# Patient Record
Sex: Female | Born: 1990 | Race: White | Hispanic: No | Smoking: Current every day smoker
Health system: Southern US, Community
[De-identification: ages and names within clinical notes are randomized; demographics above are authoritative.]

## PROBLEM LIST (undated history)

## (undated) DIAGNOSIS — F329 Major depressive disorder, single episode, unspecified: Secondary | ICD-10-CM

## (undated) DIAGNOSIS — J302 Other seasonal allergic rhinitis: Secondary | ICD-10-CM

## (undated) DIAGNOSIS — J45909 Unspecified asthma, uncomplicated: Secondary | ICD-10-CM

## (undated) DIAGNOSIS — F32A Depression, unspecified: Secondary | ICD-10-CM

## (undated) DIAGNOSIS — F419 Anxiety disorder, unspecified: Secondary | ICD-10-CM

## (undated) DIAGNOSIS — F988 Other specified behavioral and emotional disorders with onset usually occurring in childhood and adolescence: Secondary | ICD-10-CM

## (undated) HISTORY — DX: Depression, unspecified: F32.A

## (undated) HISTORY — DX: Other specified behavioral and emotional disorders with onset usually occurring in childhood and adolescence: F98.8

## (undated) HISTORY — DX: Unspecified asthma, uncomplicated: J45.909

## (undated) HISTORY — PX: ORIF FOREARM FRACTURE: SHX2124

## (undated) HISTORY — DX: Anxiety disorder, unspecified: F41.9

## (undated) HISTORY — DX: Major depressive disorder, single episode, unspecified: F32.9

## (undated) HISTORY — PX: WISDOM TOOTH EXTRACTION: SHX21

---
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
## 2012-12-08 ENCOUNTER — Encounter (HOSPITAL_COMMUNITY): Payer: Self-pay

## 2012-12-08 ENCOUNTER — Emergency Department (HOSPITAL_COMMUNITY)
Admission: EM | Admit: 2012-12-08 | Discharge: 2012-12-08 | Disposition: A | Discharge status: Home / Self Care | Payer: Self-pay | Attending: Emergency Medicine | Admitting: Emergency Medicine

## 2012-12-08 DIAGNOSIS — F17200 Nicotine dependence, unspecified, uncomplicated: Secondary | ICD-10-CM | POA: Insufficient documentation

## 2012-12-08 DIAGNOSIS — Z79899 Other long term (current) drug therapy: Secondary | ICD-10-CM | POA: Insufficient documentation

## 2012-12-08 DIAGNOSIS — J069 Acute upper respiratory infection, unspecified: Secondary | ICD-10-CM

## 2012-12-08 DIAGNOSIS — R0982 Postnasal drip: Secondary | ICD-10-CM

## 2012-12-08 DIAGNOSIS — J029 Acute pharyngitis, unspecified: Secondary | ICD-10-CM

## 2012-12-08 MED ORDER — GUAIFENESIN ER 600 MG PO TB12: 1200.0000 mg | ORAL_TABLET | Freq: Two times a day (BID) | ORAL | Status: DC

## 2012-12-08 MED ORDER — DIPHENHYDRAMINE HCL 12.5 MG/5ML PO ELIX
25.0000 mg | ORAL_SOLUTION | Freq: Once | ORAL | Status: AC
  Administered 2012-12-08: 25 mg via ORAL
  Filled 2012-12-08: qty 10

## 2012-12-08 MED ORDER — GI COCKTAIL ~~LOC~~
30.0000 mL | Freq: Once | ORAL | Status: AC
  Administered 2012-12-08: 30 mL via ORAL
  Filled 2012-12-08: qty 30

## 2012-12-08 MED ORDER — HYDROCODONE-ACETAMINOPHEN 5-325 MG PO TABS
1.0000 | ORAL_TABLET | Freq: Once | ORAL | Status: AC
  Administered 2012-12-08: 1 via ORAL
  Filled 2012-12-08: qty 1

## 2012-12-08 NOTE — ED Provider Notes (Signed)
Medical screening examination/treatment/procedure(s) were performed by non-physician practitioner and as supervising physician I was immediately available for consultation/collaboration.  Jones Skene, M.D.     Jones Skene, MD 12/08/12 [phone]

## 2012-12-08 NOTE — ED Notes (Signed)
Patient presents with c/o headache, fever, chills, runny/stuffy nose, sore throat, non productive cough x 3 days. Taken OTC cold/flu medications that relieved symptoms except for sore throat. Hurts to swallow. Denies sweats or chills.

## 2012-12-08 NOTE — ED Provider Notes (Signed)
History     CSN: [id number]  Arrival date & time 12/08/12  0226   First MD Initiated Contact with Patient 12/08/12 [phone]      Chief Complaint  Patient presents with  . URI  . Sore Throat   HPI  History provided by the patient. Patient is a 22 year old female with no significant PMH who presents with complaints of headache, sore throat, nasal congestion. Symptoms first began Friday and have been waxing and waning but progressively worsening. Patient has used some over-the-counter cough and cold medicines and states that some of her symptoms are improving but her sore throat is much worse especially at night and early this morning. She reports headache has improved and some of her congestion and runny does also improved. She still reports some slight congestion and a little slight sinus pressure. Denies any neck pain or stiffness. Denies any fever, chills or sweats. No appetite change. No nausea, vomiting or diarrhea. No sick contacts. No recent travel. No other aggravating or alleviating factors. No other associated symptoms.    History reviewed. No pertinent past medical history.  Past Surgical History  Procedure Laterality Date  . Wisdom tooth extraction    . Orif forearm fracture Right     No family history on file.  History  Substance Use Topics  . Smoking status: Current Every Day Smoker -- 0.50 packs/day    Types: Cigarettes  . Smokeless tobacco: Never Used  . Alcohol Use: Yes    OB History   Grav Para Term Preterm Abortions TAB SAB Ect Mult Living                  Review of Systems  Constitutional: Negative for fever and chills.  HENT: Positive for congestion, sore throat and sinus pressure.   Respiratory: Positive for cough.   Gastrointestinal: Negative for nausea, vomiting, diarrhea and constipation.  Neurological: Positive for headaches.  All other systems reviewed and are negative.    Allergies  Review of patient's allergies indicates no known  allergies.  Home Medications   Current Outpatient Rx  Name  Route  Sig  Dispense  Refill  . DULoxetine (CYMBALTA) 60 MG capsule   Oral   Take 60 mg by mouth daily.         Marland Kitchen gabapentin (NEURONTIN) 100 MG capsule   Oral   Take 200 mg by mouth daily.         Marland Kitchen ibuprofen (ADVIL,MOTRIN) 200 MG tablet   Oral   Take 200 mg by mouth every 6 (six) hours as needed for pain.         Marland Kitchen lamoTRIgine (LAMICTAL) 25 MG tablet   Oral   Take 50 mg by mouth daily.         . Pseudoeph-Doxylamine-DM-APAP (NYQUIL PO)   Oral   Take 30 mLs by mouth every 6 (six) hours as needed (cold symptoms).         . Pseudoephedrine-APAP-DM (DAYQUIL PO)   Oral   Take 30 mLs by mouth every 6 (six) hours as needed (cold symptoms).           BP 129/73  Pulse 100  Temp(Src) 98.6 F (37 C) (Oral)  Resp 18  SpO2 100%  LMP 12/01/2012  Physical Exam  Nursing note and vitals reviewed. Constitutional: She is oriented to person, place, and time. She appears well-developed and well-nourished. No distress.  HENT:  Head: Normocephalic.  Right Ear: Tympanic membrane normal.  Left Ear: Tympanic membrane normal.  Mouth/Throat: Oropharynx is clear and moist.  Uvula normal and midline. Slight erythema to the pharynx. Mild cobblestoning present. Tonsils appear normal without any exudate. No signs concerning for PTA  Neck: Normal range of motion. Neck supple.  No meningeal sign  Cardiovascular: Normal rate and regular rhythm.   Pulmonary/Chest: Effort normal and breath sounds normal. No respiratory distress. She has no wheezes. She has no rales.  Abdominal: Soft.  Musculoskeletal: Normal range of motion.  Lymphadenopathy:    She has no cervical adenopathy.  Neurological: She is alert and oriented to person, place, and time.  Skin: Skin is warm and dry. No rash noted.  Psychiatric: She has a normal mood and affect. Her behavior is normal.    ED Course  Procedures       1. Pharyngitis   2.  Postnasal drip   3. URI (upper respiratory infection)       MDM  4:45 AM patient seen and evaluated. Patient well-appearing in no acute distress. She is not appears ill or toxic.        Angus Seller, PA-C 12/08/12 [phone]

## 2013-08-07 ENCOUNTER — Encounter (HOSPITAL_COMMUNITY): Payer: Self-pay | Admitting: Emergency Medicine

## 2013-08-07 ENCOUNTER — Emergency Department (HOSPITAL_COMMUNITY)
Admission: EM | Admit: 2013-08-07 | Discharge: 2013-08-07 | Disposition: A | Discharge status: Home / Self Care | Payer: Self-pay | Attending: Emergency Medicine | Admitting: Emergency Medicine

## 2013-08-07 DIAGNOSIS — J029 Acute pharyngitis, unspecified: Secondary | ICD-10-CM | POA: Insufficient documentation

## 2013-08-07 DIAGNOSIS — F17200 Nicotine dependence, unspecified, uncomplicated: Secondary | ICD-10-CM | POA: Insufficient documentation

## 2013-08-07 DIAGNOSIS — Z79899 Other long term (current) drug therapy: Secondary | ICD-10-CM | POA: Insufficient documentation

## 2013-08-07 DIAGNOSIS — R51 Headache: Secondary | ICD-10-CM | POA: Insufficient documentation

## 2013-08-07 DIAGNOSIS — B9789 Other viral agents as the cause of diseases classified elsewhere: Secondary | ICD-10-CM | POA: Insufficient documentation

## 2013-08-07 DIAGNOSIS — B349 Viral infection, unspecified: Secondary | ICD-10-CM

## 2013-08-07 LAB — RAPID STREP SCREEN (MED CTR MEBANE ONLY): Streptococcus, Group A Screen (Direct): NEGATIVE

## 2013-08-07 MED ORDER — IBUPROFEN 800 MG PO TABS: 800.0000 mg | ORAL_TABLET | Freq: Three times a day (TID) | ORAL | Status: DC

## 2013-08-07 MED ORDER — IBUPROFEN 800 MG PO TABS
800.0000 mg | ORAL_TABLET | Freq: Once | ORAL | Status: AC
  Administered 2013-08-07: 800 mg via ORAL
  Filled 2013-08-07: qty 1

## 2013-08-07 MED ORDER — GUAIFENESIN 100 MG/5ML PO LIQD: 100.0000 mg | ORAL | Status: DC | PRN

## 2013-08-07 MED ORDER — ALBUTEROL SULFATE HFA 108 (90 BASE) MCG/ACT IN AERS
2.0000 | INHALATION_SPRAY | Freq: Once | RESPIRATORY_TRACT | Status: AC
  Administered 2013-08-07: 2 via RESPIRATORY_TRACT
  Filled 2013-08-07: qty 6.7

## 2013-08-07 MED ORDER — ACETAMINOPHEN 500 MG PO TABS
1000.0000 mg | ORAL_TABLET | Freq: Once | ORAL | Status: AC
  Administered 2013-08-07: 1000 mg via ORAL
  Filled 2013-08-07: qty 2

## 2013-08-07 NOTE — ED Notes (Addendum)
Pt pulse oximetry was 95% before and 92 % after walking. Pt tolerated walking well.

## 2013-08-07 NOTE — ED Notes (Signed)
Patient returned from xray at this time.

## 2013-08-07 NOTE — ED Notes (Signed)
Dr. Opitz at bedside. 

## 2013-08-07 NOTE — ED Notes (Signed)
Patient and patient's mother are concerned about a "rash" that the patient is developing. Small, raised area noted behind left ear and on left shoulder. Patient states that it "itches." Dr. Dierdre Highman made aware.

## 2013-08-07 NOTE — ED Provider Notes (Signed)
CSN: [id number]     Arrival date & time 08/07/13  4098 History   First MD Initiated Contact with Patient 08/07/13 0402     Chief Complaint  Patient presents with  . Fever  . Headache  . Chills   (Consider location/radiation/quality/duration/timing/severity/associated sxs/prior Treatment) HPI History provided by patient. Went to bed last night in her normal state of health and woke up this morning with fever to 101, chills, body aches and sore throat. Symptoms moderate to severe. 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 Some dry cough but denies any productive sputum, hemoptysis or difficulty breathing. She did not receive a flu shot this year. No bowel pain. No nausea vomiting diarrhea. History reviewed. No pertinent past medical history. Past Surgical History  Procedure Laterality Date  . Wisdom tooth extraction    . Orif forearm fracture Right    No family history on file. History  Substance Use Topics  . Smoking status: Current Every Day Smoker -- 0.50 packs/day    Types: Cigarettes  . Smokeless tobacco: Never Used  . Alcohol Use: Yes   OB History   Grav Para Term Preterm Abortions TAB SAB Ect Mult Living                 Review of Systems  Constitutional: Positive for fever and chills.  HENT: Positive for sore throat.   Eyes: Negative for visual disturbance.  Respiratory: Negative for shortness of breath.   Cardiovascular: Negative for chest pain.  Gastrointestinal: Negative for abdominal pain.  Genitourinary: Negative for dysuria.  Musculoskeletal: Negative for back pain, neck pain and neck stiffness.  Skin: Negative for rash.  Neurological: Negative for headaches.  All other systems reviewed and are negative.    Allergies  Review of patient's allergies indicates no known allergies.  Home Medications   Current Outpatient Rx  Name  Route  Sig  Dispense  Refill  . DULoxetine (CYMBALTA) 60 MG capsule   Oral   Take 60 mg by mouth daily.          Marland Kitchen gabapentin (NEURONTIN) 100 MG capsule   Oral   Take 100 mg by mouth 2 (two) times daily.          . LamoTRIgine 50 MG TBDP   Oral   Take 25 mg by mouth daily.          BP 107/53  Pulse 121  Ht 5\' 5"  (1.651 m)  Wt 245 lb (111.131 kg)  BMI 40.77 kg/m2  SpO2 96% Physical Exam  Constitutional: She is oriented to person, place, and time. She appears well-developed and well-nourished.  HENT:  Head: Normocephalic and atraumatic.  Enlarged tonsils without erythema or exudates. Moist mucous membranes.nasal congestion.  Eyes: EOM are normal. Pupils are equal, round, and reactive to light.  Neck: Neck supple. No tracheal deviation present.  Cardiovascular: Regular rhythm and intact distal pulses.   tachycardic  Pulmonary/Chest: Effort normal and breath sounds normal. No stridor. No respiratory distress. She exhibits no tenderness.  Abdominal: Soft. She exhibits no distension. There is no tenderness.  Musculoskeletal: Normal range of motion. She exhibits no edema.  Neurological: She is alert and oriented to person, place, and time.  Skin: Skin is warm and dry.    ED Course  Procedures (including critical care time) Labs Review Labs Reviewed  RAPID STREP SCREEN  CULTURE, GROUP A STREP   By mouth fluids. Tylenol. Motrin.  Nursing notes reviewed with document pulse ox 81%. Patient ambulating  emergency department without any dyspnea or hypoxia. Normal pulmonary exam without wheezes. Presentation suggests viral infection with negative rapid strep test. On recheck is symptomatically improving. Plan discharge home with viral precautions and close outpatient followup. Prescription for Motrin, Robitussin provided. Patient sent home with albuterol inhaler as needed by request.  MDM  Diagnosis: Viral infection  Medications provided Rapid strep test was negative Symptomatically improved on recheck Vital signs and nurses notes reviewed and considered  Sunnie Nielsen, MD 08/07/13 [phone]

## 2013-08-07 NOTE — ED Notes (Signed)
Patient began having fever/chills starting last night. Patient is also having a headache that started when she got out of bed and was moving around. Patient states that the pain is int he occipital portion of her head. Denies n/v.

## 2013-08-07 NOTE — ED Notes (Signed)
Patient currently ambulating with EMT. No distress noted.

## 2013-08-09 LAB — CULTURE, GROUP A STREP

## 2013-10-09 ENCOUNTER — Emergency Department (HOSPITAL_COMMUNITY)
Admission: EM | Admit: 2013-10-09 | Discharge: 2013-10-09 | Disposition: A | Discharge status: Home / Self Care | Payer: Self-pay | Attending: Emergency Medicine | Admitting: Emergency Medicine

## 2013-10-09 ENCOUNTER — Encounter (HOSPITAL_COMMUNITY): Payer: Self-pay | Admitting: Emergency Medicine

## 2013-10-09 DIAGNOSIS — Z3202 Encounter for pregnancy test, result negative: Secondary | ICD-10-CM | POA: Insufficient documentation

## 2013-10-09 DIAGNOSIS — F17200 Nicotine dependence, unspecified, uncomplicated: Secondary | ICD-10-CM | POA: Insufficient documentation

## 2013-10-09 DIAGNOSIS — R000 Tachycardia, unspecified: Secondary | ICD-10-CM | POA: Insufficient documentation

## 2013-10-09 DIAGNOSIS — Z79899 Other long term (current) drug therapy: Secondary | ICD-10-CM | POA: Insufficient documentation

## 2013-10-09 DIAGNOSIS — J111 Influenza due to unidentified influenza virus with other respiratory manifestations: Secondary | ICD-10-CM | POA: Insufficient documentation

## 2013-10-09 LAB — CBC WITH DIFFERENTIAL/PLATELET
Basophils Absolute: 0 10*3/uL (ref 0.0–0.1)
Basophils Relative: 0 % (ref 0–1)
Eosinophils Absolute: 0 10*3/uL (ref 0.0–0.7)
Eosinophils Relative: 0 % (ref 0–5)
HCT: 44.1 % (ref 36.0–46.0)
Hemoglobin: 15.4 g/dL — ABNORMAL HIGH (ref 12.0–15.0)
Lymphocytes Relative: 6 % — ABNORMAL LOW (ref 12–46)
Lymphs Abs: 0.5 10*3/uL — ABNORMAL LOW (ref 0.7–4.0)
MCH: 32.4 pg (ref 26.0–34.0)
MCHC: 34.9 g/dL (ref 30.0–36.0)
MCV: 92.6 fL (ref 78.0–100.0)
Monocytes Absolute: 0.5 10*3/uL (ref 0.1–1.0)
Monocytes Relative: 6 % (ref 3–12)
Neutro Abs: 7.7 10*3/uL (ref 1.7–7.7)
Neutrophils Relative %: 89 % — ABNORMAL HIGH (ref 43–77)
Platelets: 313 10*3/uL (ref 150–400)
RBC: 4.76 MIL/uL (ref 3.87–5.11)
RDW: 12.8 % (ref 11.5–15.5)
WBC: 8.7 10*3/uL (ref 4.0–10.5)

## 2013-10-09 LAB — URINALYSIS, ROUTINE W REFLEX MICROSCOPIC
Bilirubin Urine: NEGATIVE
Glucose, UA: NEGATIVE mg/dL
Hgb urine dipstick: NEGATIVE
Ketones, ur: NEGATIVE mg/dL
Nitrite: NEGATIVE
Protein, ur: NEGATIVE mg/dL
Specific Gravity, Urine: 1.021 (ref 1.005–1.030)
Urobilinogen, UA: 0.2 mg/dL (ref 0.0–1.0)
pH: 6 (ref 5.0–8.0)

## 2013-10-09 LAB — COMPREHENSIVE METABOLIC PANEL
ALT: 13 U/L (ref 0–35)
AST: 19 U/L (ref 0–37)
Albumin: 3.7 g/dL (ref 3.5–5.2)
Alkaline Phosphatase: 94 U/L (ref 39–117)
BUN: 7 mg/dL (ref 6–23)
CO2: 23 mEq/L (ref 19–32)
Calcium: 9.1 mg/dL (ref 8.4–10.5)
Chloride: 97 mEq/L (ref 96–112)
Creatinine, Ser: 0.6 mg/dL (ref 0.50–1.10)
GFR calc Af Amer: >90 mL/min (ref >90)
GFR calc non Af Amer: >90 mL/min (ref >90)
Glucose, Bld: 118 mg/dL — ABNORMAL HIGH (ref 70–99)
Potassium: 3.5 mEq/L — ABNORMAL LOW (ref 3.7–5.3)
Sodium: 136 mEq/L — ABNORMAL LOW (ref 137–147)
Total Bilirubin: 0.4 mg/dL (ref 0.3–1.2)
Total Protein: 8.6 g/dL — ABNORMAL HIGH (ref 6.0–8.3)

## 2013-10-09 LAB — LIPASE, BLOOD: Lipase: 14 U/L (ref 11–59)

## 2013-10-09 LAB — POCT PREGNANCY, URINE: Preg Test, Ur: NEGATIVE

## 2013-10-09 LAB — URINE MICROSCOPIC-ADD ON

## 2013-10-09 MED ORDER — ONDANSETRON 4 MG PO TBDP
8.0000 mg | ORAL_TABLET | Freq: Once | ORAL | Status: AC
  Administered 2013-10-09: 8 mg via ORAL
  Filled 2013-10-09: qty 2

## 2013-10-09 MED ORDER — KETOROLAC TROMETHAMINE 30 MG/ML IJ SOLN
30.0000 mg | Freq: Once | INTRAMUSCULAR | Status: AC
  Administered 2013-10-09: 30 mg via INTRAVENOUS
  Filled 2013-10-09: qty 1

## 2013-10-09 MED ORDER — ONDANSETRON HCL 4 MG PO TABS: 4.0000 mg | ORAL_TABLET | Freq: Four times a day (QID) | ORAL | Status: DC

## 2013-10-09 MED ORDER — POTASSIUM CHLORIDE CRYS ER 20 MEQ PO TBCR
40.0000 meq | EXTENDED_RELEASE_TABLET | Freq: Once | ORAL | Status: AC
Start: 2013-10-09 — End: 2013-10-09
  Administered 2013-10-09: 40 meq via ORAL
  Filled 2013-10-09: qty 2

## 2013-10-09 MED ORDER — SODIUM CHLORIDE 0.9 % IV BOLUS (SEPSIS)
1000.0000 mL | Freq: Once | INTRAVENOUS | Status: AC
  Administered 2013-10-09: 1000 mL via INTRAVENOUS

## 2013-10-09 NOTE — ED Provider Notes (Signed)
CSN: [id number]     Arrival date & time 10/09/13  1802 History   First MD Initiated Contact with Patient 10/09/13 1825     Chief Complaint  Patient presents with  . Fever  . Nausea  . Emesis   (Consider location/radiation/quality/duration/timing/severity/associated sxs/prior Treatment) Patient is a 23 y.o. female presenting with vomiting and flu symptoms. The history is provided by the patient. No language interpreter was used.  Emesis Severity:  Mild Duration:  2 hours Timing:  Intermittent Number of daily episodes:  1 Quality:  Stomach contents and undigested food Progression:  Resolved Chronicity:  New Associated symptoms: chills, diarrhea, headaches and myalgias   Associated symptoms: no abdominal pain, no arthralgias and no sore throat   Diarrhea:    Quality:  Watery   Number of occurrences:  >10   Severity:  Moderate   Duration:  2 days   Timing:  Intermittent   Progression:  Unchanged Headaches:    Severity:  Mild Influenza Presenting symptoms: diarrhea, fatigue, fever, headache, myalgias, nausea, rhinorrhea and vomiting   Presenting symptoms: no cough, no shortness of breath and no sore throat   Severity:  Moderate Onset quality:  Sudden Duration:  2 days Progression:  Worsening Chronicity:  New Relieved by:  Nothing Worsened by:  Eating Ineffective treatments:  OTC medications Associated symptoms: chills, decreased appetite, decreased physical activity and nasal congestion   Associated symptoms: no ear pain, no mental status change, no neck stiffness and no witnessed syncope   Risk factors: sick contacts   Risk factors: no diabetes problem, no heart disease, no immunocompromised state, no kidney disease, no liver disease and not pregnant     History reviewed. No pertinent past medical history. Past Surgical History  Procedure Laterality Date  . Wisdom tooth extraction    . Orif forearm fracture Right    No family history on file. History  Substance Use  Topics  . Smoking status: Current Every Day Smoker -- 0.50 packs/day    Types: Cigarettes  . Smokeless tobacco: Never Used  . Alcohol Use: Yes   OB History   Grav Para Term Preterm Abortions TAB SAB Ect Mult Living                 Review of Systems  Constitutional: Positive for fever, chills, activity change, appetite change, fatigue and decreased appetite. Negative for diaphoresis.  HENT: Positive for congestion and rhinorrhea. Negative for ear pain, facial swelling and sore throat.   Eyes: Negative for photophobia and discharge.  Respiratory: Negative for cough, chest tightness and shortness of breath.   Cardiovascular: Negative for chest pain, palpitations and leg swelling.  Gastrointestinal: Positive for nausea, vomiting and diarrhea. Negative for abdominal pain.  Endocrine: Negative for polydipsia and polyuria.  Genitourinary: Negative for dysuria, frequency, difficulty urinating and pelvic pain.  Musculoskeletal: Positive for myalgias. Negative for arthralgias, back pain, neck pain and neck stiffness.  Skin: Negative for color change and wound.  Allergic/Immunologic: Negative for immunocompromised state.  Neurological: Positive for headaches. Negative for facial asymmetry, weakness and numbness.  Hematological: Does not bruise/bleed easily.  Psychiatric/Behavioral: Negative for confusion and agitation.    Allergies  Review of patient's allergies indicates no known allergies.  Home Medications   Current Outpatient Rx  Name  Route  Sig  Dispense  Refill  . DULoxetine (CYMBALTA) 60 MG capsule   Oral   Take 60 mg by mouth at bedtime.          . gabapentin (NEURONTIN) 100  MG capsule   Oral   Take 100 mg by mouth 2 (two) times daily.          Marland Kitchen ibuprofen (ADVIL,MOTRIN) 800 MG tablet   Oral   Take 800 mg by mouth every 8 (eight) hours as needed.         . LamoTRIgine 50 MG TBDP   Oral   Take 25 mg by mouth at bedtime.          BP 114/52  Pulse 120   Temp(Src) 99.6 F (37.6 C) (Oral)  Resp 20  SpO2 100%  LMP 09/16/2013 Physical Exam  Constitutional: She is oriented to person, place, and time. She appears well-developed and well-nourished. No distress.  HENT:  Head: Normocephalic and atraumatic.  Mouth/Throat: No oropharyngeal exudate.  Eyes: Pupils are equal, round, and reactive to light.  Neck: Normal range of motion. Neck supple.  Cardiovascular: Regular rhythm and normal heart sounds.  Tachycardia present.  Exam reveals no gallop and no friction rub.   No murmur heard. Pulmonary/Chest: Effort normal and breath sounds normal. No respiratory distress. She has no wheezes. She has no rales.  Abdominal: Soft. Bowel sounds are normal. She exhibits no distension and no mass. There is no tenderness. There is no rebound and no guarding.  Musculoskeletal: Normal range of motion. She exhibits no edema and no tenderness.  Neurological: She is alert and oriented to person, place, and time.  Skin: Skin is warm and dry.  Psychiatric: She has a normal mood and affect.    ED Course  Procedures (including critical care time) Labs Review Labs Reviewed  CBC WITH DIFFERENTIAL - Abnormal; Notable for the following:    Hemoglobin 15.4 (*)    Neutrophils Relative % 89 (*)    Lymphocytes Relative 6 (*)    Lymphs Abs 0.5 (*)    All other components within normal limits  COMPREHENSIVE METABOLIC PANEL - Abnormal; Notable for the following:    Sodium 136 (*)    Potassium 3.5 (*)    Glucose, Bld 118 (*)    Total Protein 8.6 (*)    All other components within normal limits  LIPASE, BLOOD  URINALYSIS, ROUTINE W REFLEX MICROSCOPIC   Imaging Review No results found.  EKG Interpretation   None       MDM  No diagnosis found. SUBJECTIVE:  Judith Hansen is a 23 y.o. female who present complaining of flu-like symptoms: fevers, chills, myalgias, congestion, nausea, vom x1, watery diarrhea, for 2 days. Denies dyspnea or wheezing. Sick contacts  with similar symptoms.   OBJECTIVE: Appears moderately ill but not toxic; temperature as noted in vitals. Ears normal. Throat and pharynx normal.  Neck supple. No adenopathy in the neck. Sinuses non tender. The chest is clear.  ASSESSMENT: Influenza  PLAN: Symptomatic therapy suggested: rest, increase fluids, OTC acetaminophen, ibuprofen.  IVF & IV toradol given in ED with improvement of HR. Return precautions given for new or worsening symptoms including inability to tolerate PO, SOB.      Shanna Cisco, MD 10/10/13 [phone]

## 2013-10-09 NOTE — ED Notes (Signed)
Pt aware of need to provide urine sample. Pts tates she cant pee at this time.

## 2013-10-09 NOTE — ED Notes (Signed)
Pt reports last night n/v/d, fever. Has taken ibuprofen liquid gels several times today. No fever at current, has been drinking ice water. Pt reports epigastric soreness from vomiting. Pt is a x 4. Mask in place.

## 2013-10-09 NOTE — Discharge Instructions (Signed)

## 2013-10-09 NOTE — ED Notes (Signed)
Pt states symptoms started last night, pt states she layed down after work, woke up in the middle of the night with severe abdominal pain. Pt states she also started liquid diarrhea, vomiting. Pt c/o epigastric pain from vomiting. Denies abdominal pain at this time. Pt c/o headache 5/10. States shes had diarrhea all day. Pt states shes been drinking water and ginger ale. No problems keeping that down. Pt AAOx4.

## 2014-03-03 ENCOUNTER — Encounter: Payer: Self-pay | Admitting: Family Medicine

## 2014-03-03 DIAGNOSIS — F329 Major depressive disorder, single episode, unspecified: Secondary | ICD-10-CM | POA: Insufficient documentation

## 2014-03-03 DIAGNOSIS — F988 Other specified behavioral and emotional disorders with onset usually occurring in childhood and adolescence: Secondary | ICD-10-CM | POA: Insufficient documentation

## 2014-03-03 DIAGNOSIS — J45909 Unspecified asthma, uncomplicated: Secondary | ICD-10-CM | POA: Insufficient documentation

## 2014-03-03 DIAGNOSIS — F419 Anxiety disorder, unspecified: Secondary | ICD-10-CM | POA: Insufficient documentation

## 2014-03-23 ENCOUNTER — Ambulatory Visit: Payer: BC Managed Care – PPO | Admitting: Physician Assistant

## 2014-03-28 ENCOUNTER — Ambulatory Visit (INDEPENDENT_AMBULATORY_CARE_PROVIDER_SITE_OTHER): Payer: BC Managed Care – PPO | Admitting: Physician Assistant

## 2014-03-28 ENCOUNTER — Encounter: Payer: Self-pay | Admitting: Physician Assistant

## 2014-03-28 VITALS — BP 120/80 | HR 78 | Temp 97.7°F | Resp 16 | Ht 65.5 in | Wt 251.0 lb

## 2014-03-28 DIAGNOSIS — Z0000 Encounter for general adult medical examination without abnormal findings: Secondary | ICD-10-CM

## 2014-03-28 DIAGNOSIS — Z720 Tobacco use: Secondary | ICD-10-CM | POA: Insufficient documentation

## 2014-03-28 DIAGNOSIS — E669 Obesity, unspecified: Secondary | ICD-10-CM

## 2014-03-28 DIAGNOSIS — F329 Major depressive disorder, single episode, unspecified: Secondary | ICD-10-CM

## 2014-03-28 DIAGNOSIS — F32A Depression, unspecified: Secondary | ICD-10-CM

## 2014-03-28 DIAGNOSIS — Z23 Encounter for immunization: Secondary | ICD-10-CM

## 2014-03-28 DIAGNOSIS — F988 Other specified behavioral and emotional disorders with onset usually occurring in childhood and adolescence: Secondary | ICD-10-CM

## 2014-03-28 DIAGNOSIS — F411 Generalized anxiety disorder: Secondary | ICD-10-CM

## 2014-03-28 DIAGNOSIS — F419 Anxiety disorder, unspecified: Secondary | ICD-10-CM

## 2014-03-28 DIAGNOSIS — J4520 Mild intermittent asthma, uncomplicated: Secondary | ICD-10-CM

## 2014-03-28 DIAGNOSIS — F3289 Other specified depressive episodes: Secondary | ICD-10-CM

## 2014-03-28 DIAGNOSIS — F17200 Nicotine dependence, unspecified, uncomplicated: Secondary | ICD-10-CM

## 2014-03-28 DIAGNOSIS — J45909 Unspecified asthma, uncomplicated: Secondary | ICD-10-CM

## 2014-03-28 MED ORDER — ALBUTEROL SULFATE HFA 108 (90 BASE) MCG/ACT IN AERS: 2.0000 | INHALATION_SPRAY | Freq: Four times a day (QID) | RESPIRATORY_TRACT | Status: DC | PRN

## 2014-03-28 MED ORDER — BECLOMETHASONE DIPROPIONATE 80 MCG/ACT IN AERS: 1.0000 | INHALATION_SPRAY | Freq: Two times a day (BID) | RESPIRATORY_TRACT | Status: DC

## 2014-03-28 NOTE — Progress Notes (Signed)
Patient ID: Judith Hansen MRN: [id number], DOB: [birth year], 23 y.o. Date of Encounter: 03/28/2014,   Chief Complaint: Physical (CPE)  HPI: 23 y.o. y/o white female  here for CPE.   She is also being seen as a new patient today to establish care with our office.  She has a gynecologist who she sees. She also has a psychiatrist who she sees on a routine basis. No other medical providers that she sees on a regular basis.  She says that she has been using her mom's Symbicort on a daily basis. Says that if she does not use this, then she has a lot of flares and wheezing.  She currently is out of any type of medication for her asthma.  She has no concerns or issues that she was wanting to discuss today. She is not fasting and is not interested in returning fasting for lab work. Says that she has had some blood work done with her gynecologist fairly recently.  She does mention that a friend of hers had used Phenteramine and had good success with weight loss.   Review of Systems: Consitutional: No fever, chills, fatigue, night sweats, lymphadenopathy. No significant/unexplained weight changes. Eyes: No visual changes, eye redness, or discharge. ENT/Mouth: No ear pain, sore throat, nasal drainage, or sinus pain. Cardiovascular: No chest pressure,heaviness, tightness or squeezing, even with exertion. No increased shortness of breath or dyspnea on exertion.No palpitations, edema, orthopnea, PND. Respiratory: No cough, hemoptysis, SOB, or wheezing. Gastrointestinal: No anorexia, dysphagia, reflux, pain, nausea, vomiting, hematemesis, diarrhea, constipation, BRBPR, or melena. Breast: No mass, nodules, bulging, or retraction. No skin changes or inflammation. No nipple discharge. No lymphadenopathy. Genitourinary: No dysuria, hematuria, incontinence, vaginal discharge, pruritis, burning, abnormal bleeding, or pain. Musculoskeletal: No decreased ROM, No joint pain or swelling. No significant  pain in neck, back, or extremities. Skin: No rash, pruritis, or concerning lesions. Neurological: No headache, dizziness, syncope, seizures, tremors, memory loss, coordination problems, or paresthesias. Psychological: No anxiety, depression, hallucinations, SI/HI. Endocrine: No polydipsia, polyphagia, polyuria, or known diabetes.No increased fatigue. No palpitations/rapid heart rate. No significant/unexplained weight change. All other systems were reviewed and are otherwise negative.  Past Medical History  Diagnosis Date  . Anxiety   . Asthma   . Depression   . ADD (attention deficit disorder)      Past Surgical History  Procedure Laterality Date  . Wisdom tooth extraction    . Orif forearm fracture Right   . Fracture surgery  2004    rt arm has plate    Home Meds:  Outpatient Prescriptions Prior to Visit  Medication Sig Dispense Refill  . DULoxetine (CYMBALTA) 60 MG capsule Take 60 mg by mouth at bedtime.       . gabapentin (NEURONTIN) 100 MG capsule Take 100 mg by mouth 2 (two) times daily.       Marland Kitchen ibuprofen (ADVIL,MOTRIN) 800 MG tablet Take 800 mg by mouth every 8 (eight) hours as needed.      . LamoTRIgine 50 MG TBDP Take 25 mg by mouth at bedtime.      . ondansetron (ZOFRAN) 4 MG tablet Take 1 tablet (4 mg total) by mouth every 6 (six) hours.  12 tablet  0   No facility-administered medications prior to visit.    Allergies: No Known Allergies  History   Social History  . Marital Status: Single    Spouse Name: N/A    Number of Children: N/A  . Years of Education: N/A   Occupational  History  . Not on file.   Social History Main Topics  . Smoking status: Current Every Day Smoker -- 0.50 packs/day    Types: Cigarettes  . Smokeless tobacco: Current User  . Alcohol Use: 1.1 oz/week    1 Cans of beer, 1 Drinks containing 0.5 oz of alcohol per week  . Drug Use: No  . Sexual Activity: Yes    Birth Control/ Protection: None   Other Topics Concern  . 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 Alcohol abuse Mother   . Arthritis Mother   . Depression Mother   . Heart disease Maternal Grandfather     Physical Exam: Blood pressure 120/80, pulse 78, temperature 97.7 F (36.5 C), temperature source Oral, resp. rate 16, height 5' 5.5" (1.664 m), weight 251 lb (113.853 kg), last menstrual period 03/13/2014., Body mass index is 41.12 kg/(m^2). General: Obese WF. Appears in no acute distress. HEENT: Normocephalic, atraumatic. Conjunctiva pink, sclera non-icteric. Pupils 2 mm constricting to 1 mm, round, regular, and equally reactive to light and accomodation. EOMI. Internal auditory canal clear. TMs with good cone of light and without pathology. Nasal mucosa pink. Nares are without discharge. No sinus tenderness. Oral mucosa pink.  Pharynx without exudate.   Neck: Supple. Trachea midline. No thyromegaly. Full ROM. No lymphadenopathy.No Carotid Bruits. Lungs: Clear to auscultation bilaterally without wheezes, rales, or rhonchi. Breathing is of normal effort and unlabored. Cardiovascular: RRR with S1 S2. No murmurs, rubs, or gallops. Distal pulses 2+ symmetrically. No carotid or abdominal bruits. Breast: Deferred. Per Gyn. Abdomen: Soft, non-tender, non-distended with normoactive bowel sounds. No hepatosplenomegaly or masses. No rebound/guarding. No CVA tenderness. No hernias.  Genitourinary: Deferred. Per Gyn. Musculoskeletal: Full range of motion and 5/5 strength throughout. Without swelling, atrophy, tenderness, crepitus, or warmth. Skin: Warm and moist without erythema, ecchymosis, wounds, or rash. Neuro: A+Ox3. CN II-XII grossly intact. Moves all extremities spontaneously. Full sensation throughout. Normal gait. DTR 2+ throughout upper and lower extremities. Finger to nose intact. Psych:  Responds to questions appropriately with a  normal affect.   Assessment/Plan:  23 y.o. y/o female here for CPE 1. Visit for preventive health examination  A. Screening Labs: She is not fasting today and is not interested in returning fasting for labs.  Says her gynecologist has checked some lab work in the last couple years.  B. Pap: Per Gynecology   C. Immunizations:  Influenza: N/A Tetanus:  Patient states that this is not up to date. Says that she does frequently get cuts etc. with her job. She does want to update tetanus vaccine today. Pneumococcal: Need to get her shot records. She is a smoker and does have asthma, she would need a Pneumovax 23. Will try to get shot records. We'll get this at her next office visit here if she has not received this in the past.  2. Asthma, mild intermittent, uncomplicated - beclomethasone (QVAR) 80 MCG/ACT inhaler; Inhale 1 puff into the lungs 2 (two) times daily.  Dispense: 1 Inhaler; Refill: 12 - albuterol (PROVENTIL HFA;VENTOLIN HFA) 108 (90 BASE) MCG/ACT inhaler; Inhale 2 puffs into the lungs every 6 (six) hours as needed for wheezing or shortness of breath.  Dispense: 1 Inhaler; Refill: 0  3. Smoker Discussed need for cessation especially given her asthma.  4. Obesity Discussed phentermine  with her. Discussed that she definitely needs to start some type of routine exercise and make significant diet changes. No prescription given today. She is to start making diet and exercise changes and followup.  5. Anxiety Per psychiatry  6. Depression Per psychiatry  7. ADD (attention deficit disorder) Per psychiatry   Signed, [address]Rickard Kennerly Beth Harmony[place]ixon, [place], [facility] 03/28/2014 9:15 AM

## 2014-06-01 ENCOUNTER — Encounter: Payer: Self-pay | Admitting: Physician Assistant

## 2014-06-01 ENCOUNTER — Ambulatory Visit (INDEPENDENT_AMBULATORY_CARE_PROVIDER_SITE_OTHER): Payer: BC Managed Care – PPO | Admitting: Physician Assistant

## 2014-06-01 VITALS — BP 104/70 | HR 88 | Temp 98.1°F | Resp 18 | Wt 248.0 lb

## 2014-06-01 DIAGNOSIS — J029 Acute pharyngitis, unspecified: Secondary | ICD-10-CM

## 2014-06-01 LAB — RAPID STREP SCREEN (MED CTR MEBANE ONLY): Streptococcus, Group A Screen (Direct): NEGATIVE

## 2014-06-01 NOTE — Progress Notes (Signed)
    Patient ID: Judith LevyChelsea Hansen MRN: [id number], DOB: [birth year], 23 y.o. Date of Encounter: 06/01/2014, 10:25 AM    Chief Complaint:  Chief Complaint  Patient presents with  . sore throat x 1 day    much worse in 1 day feels like moved into chest     HPI: 23 y.o. year old obese white female reports that her throat just started bothering her yesterday. Says her throat is sore and also feels like there is a "lump of drainage there that is hard to get down". Has had no fever or chills. No mucus from the nose no cough, no phlegm from the chest.     Home Meds:   Outpatient Prescriptions Prior to Visit  Medication Sig Dispense Refill  . albuterol (PROVENTIL HFA;VENTOLIN HFA) 108 (90 BASE) MCG/ACT inhaler Inhale 2 puffs into the lungs every 6 (six) hours as needed for wheezing or shortness of breath.  1 Inhaler  0  . beclomethasone (QVAR) 80 MCG/ACT inhaler Inhale 1 puff into the lungs 2 (two) times daily.  1 Inhaler  12  . DULoxetine (CYMBALTA) 60 MG capsule Take 60 mg by mouth at bedtime.       . gabapentin (NEURONTIN) 100 MG capsule Take 100 mg by mouth 2 (two) times daily.       Marland Kitchen. ibuprofen (ADVIL,MOTRIN) 800 MG tablet Take 800 mg by mouth every 8 (eight) hours as needed.      . LamoTRIgine 50 MG TBDP Take 25 mg by mouth at bedtime.       No facility-administered medications prior to visit.    Allergies: No Known Allergies    Review of Systems: See HPI for pertinent ROS. All other ROS negative.    Physical Exam: Blood pressure 104/70, pulse 88, temperature 98.1 F (36.7 C), temperature source Oral, resp. rate 18, weight 248 lb (112.492 kg)., Body mass index is 40.63 kg/(m^2). General:  Obese WF. Appears in no acute distress. HEENT: Normocephalic, atraumatic, eyes without discharge, sclera non-icteric, nares are without discharge. Bilateral auditory canals clear, TM's are without perforation, pearly grey and translucent with reflective cone of light bilaterally. Oral cavity  moist. Tonsils slightly enlarged bilaterally but minimal erythema, no exudate, no peritonsillar abscess.  Neck: Supple. No thyromegaly. She reports mild tenderness with palpation of bilateral anterior cervical nodes but they are not enlarged with palpation. Lungs: Clear bilaterally to auscultation without wheezes, rales, or rhonchi. Breathing is unlabored. Heart: Regular rhythm. No murmurs, rubs, or gallops. Msk:  Strength and tone normal for age. Extremities/Skin: Warm and dry.  No rashes. Neuro: Alert and oriented X 3. Moves all extremities spontaneously. Gait is normal. CNII-XII grossly in tact. Psych:  Responds to questions appropriately with a normal affect.   Results for orders placed in visit on 06/01/14  RAPID STREP SCREEN      Result Value Ref Range   Source THROAT     Streptococcus, Group A Screen (Direct) NEG  NEGATIVE     ASSESSMENT AND PLAN:  23 y.o. year old female with  1. Viral pharyngitis Recommended that she sees lozenges, spray, Tylenol, Motrin as needed for symptomatic management. Discussed indications for her to followup with us which include: Fever, significantly increasing/worsening symptoms, continued symptoms without improvement for 5-7 days-- then call us as well.  2. Sorethroat - Rapid Strep Screen   Signed, [address]Mary Beth Dayton[place]ixon, [place], [facility] 06/01/2014 10:25 AM

## 2015-03-19 ENCOUNTER — Emergency Department (HOSPITAL_COMMUNITY): Payer: Self-pay

## 2015-03-19 ENCOUNTER — Encounter (HOSPITAL_COMMUNITY): Payer: Self-pay | Admitting: *Deleted

## 2015-03-19 ENCOUNTER — Emergency Department (HOSPITAL_COMMUNITY)
Admission: EM | Admit: 2015-03-19 | Discharge: 2015-03-19 | Disposition: A | Discharge status: Home / Self Care | Payer: Self-pay | Attending: Emergency Medicine | Admitting: Emergency Medicine

## 2015-03-19 DIAGNOSIS — F419 Anxiety disorder, unspecified: Secondary | ICD-10-CM | POA: Insufficient documentation

## 2015-03-19 DIAGNOSIS — Z720 Tobacco use: Secondary | ICD-10-CM | POA: Insufficient documentation

## 2015-03-19 DIAGNOSIS — Z79899 Other long term (current) drug therapy: Secondary | ICD-10-CM | POA: Insufficient documentation

## 2015-03-19 DIAGNOSIS — Z7951 Long term (current) use of inhaled steroids: Secondary | ICD-10-CM | POA: Insufficient documentation

## 2015-03-19 DIAGNOSIS — R197 Diarrhea, unspecified: Secondary | ICD-10-CM | POA: Insufficient documentation

## 2015-03-19 DIAGNOSIS — F329 Major depressive disorder, single episode, unspecified: Secondary | ICD-10-CM | POA: Insufficient documentation

## 2015-03-19 DIAGNOSIS — J45901 Unspecified asthma with (acute) exacerbation: Secondary | ICD-10-CM | POA: Insufficient documentation

## 2015-03-19 LAB — BASIC METABOLIC PANEL
Anion gap: 9 (ref 5–15)
BUN: 6 mg/dL (ref 6–20)
CO2: 24 mmol/L (ref 22–32)
Calcium: 9.2 mg/dL (ref 8.9–10.3)
Chloride: 105 mmol/L (ref 101–111)
Creatinine, Ser: 0.6 mg/dL (ref 0.44–1.00)
GFR calc Af Amer: >60 mL/min (ref >60)
GFR calc non Af Amer: >60 mL/min (ref >60)
Glucose, Bld: 121 mg/dL — ABNORMAL HIGH (ref 65–99)
Potassium: 3.4 mmol/L — ABNORMAL LOW (ref 3.5–5.1)
Sodium: 138 mmol/L (ref 135–145)

## 2015-03-19 LAB — CBC WITH DIFFERENTIAL/PLATELET
Basophils Absolute: 0 10*3/uL (ref 0.0–0.1)
Basophils Relative: 0 % (ref 0–1)
Eosinophils Absolute: 0.8 10*3/uL — ABNORMAL HIGH (ref 0.0–0.7)
Eosinophils Relative: 8 % — ABNORMAL HIGH (ref 0–5)
HCT: 36.4 % (ref 36.0–46.0)
Hemoglobin: 12.1 g/dL (ref 12.0–15.0)
Lymphocytes Relative: 33 % (ref 12–46)
Lymphs Abs: 3.5 10*3/uL (ref 0.7–4.0)
MCH: 30.9 pg (ref 26.0–34.0)
MCHC: 33.2 g/dL (ref 30.0–36.0)
MCV: 92.9 fL (ref 78.0–100.0)
Monocytes Absolute: 0.9 10*3/uL (ref 0.1–1.0)
Monocytes Relative: 8 % (ref 3–12)
Neutro Abs: 5.5 10*3/uL (ref 1.7–7.7)
Neutrophils Relative %: 51 % (ref 43–77)
Platelets: 293 10*3/uL (ref 150–400)
RBC: 3.92 MIL/uL (ref 3.87–5.11)
RDW: 13.3 % (ref 11.5–15.5)
WBC: 10.7 10*3/uL — ABNORMAL HIGH (ref 4.0–10.5)

## 2015-03-19 LAB — RAPID STREP SCREEN (MED CTR MEBANE ONLY): Streptococcus, Group A Screen (Direct): NEGATIVE

## 2015-03-19 MED ORDER — IPRATROPIUM-ALBUTEROL 0.5-2.5 (3) MG/3ML IN SOLN
3.0000 mL | Freq: Once | RESPIRATORY_TRACT | Status: AC
  Administered 2015-03-19: 3 mL via RESPIRATORY_TRACT
  Filled 2015-03-19: qty 3

## 2015-03-19 MED ORDER — PREDNISONE 20 MG PO TABS
60.0000 mg | ORAL_TABLET | Freq: Once | ORAL | Status: AC
  Administered 2015-03-19: 60 mg via ORAL
  Filled 2015-03-19: qty 3

## 2015-03-19 MED ORDER — ALBUTEROL SULFATE HFA 108 (90 BASE) MCG/ACT IN AERS
2.0000 | INHALATION_SPRAY | RESPIRATORY_TRACT | Status: DC | PRN
  Administered 2015-03-19: 2 via RESPIRATORY_TRACT
  Filled 2015-03-19: qty 6.7

## 2015-03-19 MED ORDER — PREDNISONE 20 MG PO TABS: 60.0000 mg | ORAL_TABLET | Freq: Every day | ORAL | Status: DC

## 2015-03-19 NOTE — Discharge Instructions (Signed)
Asthma Asthma is a recurring condition in which the airways tighten and narrow. Asthma can make it difficult to breathe. It can cause coughing, wheezing, and shortness of breath. Asthma episodes, also called asthma attacks, range from minor to life-threatening. Asthma cannot be cured, but medicines and lifestyle changes can help control it. CAUSES Asthma is believed to be caused by inherited (genetic) and environmental factors, but its exact cause is unknown. Asthma may be triggered by allergens, lung infections, or irritants in the air. Asthma triggers are different for each person. Common triggers include:   Animal dander.  Dust mites.  Cockroaches.  Pollen from trees or grass.  Mold.  Smoke.  Air pollutants such as dust, household cleaners, hair sprays, aerosol sprays, paint fumes, strong chemicals, or strong odors.  Cold air, weather changes, and winds (which increase molds and pollens in the air).  Strong emotional expressions such as crying or laughing hard.  Stress.  Certain medicines (such as aspirin) or types of drugs (such as beta-blockers).  Sulfites in foods and drinks. Foods and drinks that may contain sulfites include dried fruit, potato chips, and sparkling grape juice.  Infections or inflammatory conditions such as the flu, a cold, or an inflammation of the nasal membranes (rhinitis).  Gastroesophageal reflux disease (GERD).  Exercise or strenuous activity. SYMPTOMS Symptoms may occur immediately after asthma is triggered or many hours later. Symptoms include:  Wheezing.  Excessive nighttime or early morning coughing.  Frequent or severe coughing with a common cold.  Chest tightness.  Shortness of breath. DIAGNOSIS  The diagnosis of asthma is made by a review of your medical history and a physical exam. Tests may also be performed. These may include:  Lung function studies. These tests show how much air you breathe in and out.  Allergy  tests.  Imaging tests such as X-rays. TREATMENT  Asthma cannot be cured, but it can usually be controlled. Treatment involves identifying and avoiding your asthma triggers. It also involves medicines. There are 2 classes of medicine used for asthma treatment:   Controller medicines. These prevent asthma symptoms from occurring. They are usually taken every day.  Reliever or rescue medicines. These quickly relieve asthma symptoms. They are used as needed and provide short-term relief. Your health care provider will help you create an asthma action plan. An asthma action plan is a written plan for managing and treating your asthma attacks. It includes a list of your asthma triggers and how they may be avoided. It also includes information on when medicines should be taken and when their dosage should be changed. An action plan may also involve the use of a device called a peak flow meter. A peak flow meter measures how well the lungs are working. It helps you monitor your condition. HOME CARE INSTRUCTIONS   Take medicines only as directed by your health care provider. Speak with your health care provider if you have questions about how or when to take the medicines.  Use a peak flow meter as directed by your health care provider. Record and keep track of readings.  Understand and use the action plan to help minimize or stop an asthma attack without needing to seek medical care.  Control your home environment in the following ways to help prevent asthma attacks:  Do not smoke. Avoid being exposed to secondhand smoke.  Change your heating and air conditioning filter regularly.  Limit your use of fireplaces and wood stoves.  Get rid of pests (such as roaches and  mice) and their droppings.  Throw away plants if you see mold on them.  Clean your floors and dust regularly. Use unscented cleaning products.  Try to have someone else vacuum for you regularly. Stay out of rooms while they are  being vacuumed and for a short while afterward. If you vacuum, use a dust mask from a hardware store, a double-layered or microfilter vacuum cleaner bag, or a vacuum cleaner with a HEPA filter.  Replace carpet with wood, tile, or vinyl flooring. Carpet can trap dander and dust.  Use allergy-proof pillows, mattress covers, and box spring covers.  Wash bed sheets and blankets every week in hot water and dry them in a dryer.  Use blankets that are made of polyester or cotton.  Clean bathrooms and kitchens with bleach. If possible, have someone repaint the walls in these rooms with mold-resistant paint. Keep out of the rooms that are being cleaned and painted.  Wash hands frequently. SEEK MEDICAL CARE IF:   You have wheezing, shortness of breath, or a cough even if taking medicine to prevent attacks.  The colored mucus you cough up (sputum) is thicker than usual.  Your sputum changes from clear or white to yellow, green, gray, or bloody.  You have any problems that may be related to the medicines you are taking (such as a rash, itching, swelling, or trouble breathing).  You are using a reliever medicine more than 2-3 times per week.  Your peak flow is still at 50-79% of your personal best after following your action plan for 1 hour.  You have a fever. SEEK IMMEDIATE MEDICAL CARE IF:   You seem to be getting worse and are unresponsive to treatment during an asthma attack.  You are short of breath even at rest.  You get short of breath when doing very little physical activity.  You have difficulty eating, drinking, or talking due to asthma symptoms.  You develop chest pain.  You develop a fast heartbeat.  You have a bluish color to your lips or fingernails.  You are light-headed, dizzy, or faint.  Your peak flow is less than 50% of your personal best. MAKE SURE YOU:   Understand these instructions.  Will watch your condition.  Will get help right away if you are not  doing well or get worse. Document Released: 08/19/2005 Document Revised: 01/03/2014 Document Reviewed: 03/18/2013 [facility] Patient Information 2015 [place], [place]. This information is not intended to replace advice given to you by your health care provider. Make sure you discuss any questions you have with your health care provider.  Smoking Hazards Smoking cigarettes is extremely bad for your health. Tobacco smoke has over 200 known poisons in it. It contains the poisonous gases nitrogen oxide and carbon monoxide. There are over 60 chemicals in tobacco smoke that cause cancer. Some of the chemicals found in cigarette smoke include:   Cyanide.   Benzene.   Formaldehyde.   Methanol (wood alcohol).   Acetylene (fuel used in welding torches).   Ammonia.  Even smoking lightly shortens your life expectancy by several years. You can greatly reduce the risk of medical problems for you and your family by stopping now. Smoking is the most preventable cause of death and disease in our society. Within days of quitting smoking, your circulation improves, you decrease the risk of having a heart attack, and your lung capacity improves. There may be some increased phlegm in the first few days after quitting, and it may take months for  your lungs to clear up completely. Quitting for 10 years reduces your risk of developing lung cancer to almost that of a nonsmoker.  WHAT ARE THE RISKS OF SMOKING? Cigarette smokers have an increased risk of many serious medical problems, including:  Lung cancer.   Lung disease (such as pneumonia, bronchitis, and emphysema).   Heart attack and chest pain due to the heart not getting enough oxygen (angina).   Heart disease and peripheral blood vessel disease.   Hypertension.   Stroke.   Oral cancer (cancer of the lip, mouth, or voice box).   Bladder cancer.   Pancreatic cancer.   Cervical cancer.   Pregnancy complications, including premature  birth.   Stillbirths and smaller newborn babies, birth defects, and genetic damage to sperm.   Early menopause.   Lower estrogen level for women.   Infertility.   Facial wrinkles.   Blindness.   Increased risk of broken bones (fractures).   Senile dementia.   Stomach ulcers and internal bleeding.   Delayed wound healing and increased risk of complications during surgery. Because of secondhand smoke exposure, children of smokers have an increased risk of the following:   Sudden infant death syndrome (SIDS).   Respiratory infections.   Lung cancer.   Heart disease.   Ear infections.  WHY IS SMOKING ADDICTIVE? Nicotine is the chemical agent in tobacco that is capable of causing addiction or dependence. When you smoke and inhale, nicotine is absorbed rapidly into the bloodstream through your lungs. Both inhaled and noninhaled nicotine may be addictive.  WHAT ARE THE BENEFITS OF QUITTING?  There are many health benefits to quitting smoking. Some are:   The likelihood of developing cancer and heart disease decreases. Health improvements are seen almost immediately.   Blood pressure, pulse rate, and breathing patterns start returning to normal soon after quitting.   People who quit may see an improvement in their overall quality of life.  HOW DO YOU QUIT SMOKING? Smoking is an addiction with both physical and psychological effects, and longtime habits can be hard to change. Your health care provider can recommend:  Programs and community resources, which may include group support, education, or therapy.  Replacement products, such as patches, gum, and nasal sprays. Use these products only as directed. Do not replace cigarette smoking with electronic cigarettes (commonly called e-cigarettes). The safety of e-cigarettes is unknown, and some may contain harmful chemicals. FOR MORE INFORMATION  American Lung Association: www.lung.org  American Cancer  Society: www.cancer.org Document Released: 09/26/2004 Document Revised: 06/09/2013 Document Reviewed: 02/08/2013 [facility] Patient Information 2015 [place], [place]. This information is not intended to replace advice given to you by your health care provider. Make sure you discuss any questions you have with your health care provider.  Prednisone tablets What is this medicine? PREDNISONE (PRED ni sone) is a corticosteroid. It is commonly used to treat inflammation of the skin, joints, lungs, and other organs. Common conditions treated include asthma, allergies, and arthritis. It is also used for other conditions, such as blood disorders and diseases of the adrenal glands. This medicine may be used for other purposes; ask your health care provider or pharmacist if you have questions. COMMON BRAND NAME(S): Deltasone, Predone, Sterapred, Sterapred DS What should I tell my health care provider before I take this medicine? They need to know if you have any of these conditions: -Cushing's syndrome -diabetes -glaucoma -heart disease -high blood pressure -infection (especially a virus infection such as chickenpox, cold sores, or herpes) -kidney disease -liver  disease -mental illness -myasthenia gravis -osteoporosis -seizures -stomach or intestine problems -thyroid disease -an unusual or allergic reaction to lactose, prednisone, other medicines, foods, dyes, or preservatives -pregnant or trying to get pregnant -breast-feeding How should I use this medicine? Take this medicine by mouth with a glass of water. Follow the directions on the prescription label. Take this medicine with food. If you are taking this medicine once a day, take it in the morning. Do not take more medicine than you are told to take. Do not suddenly stop taking your medicine because you may develop a severe reaction. Your doctor will tell you how much medicine to take. If your doctor wants you to stop the medicine, the dose may  be slowly lowered over time to avoid any side effects. Talk to your pediatrician regarding the use of this medicine in children. Special care may be needed. Overdosage: If you think you have taken too much of this medicine contact a poison control center or emergency room at once. NOTE: This medicine is only for you. Do not share this medicine with others. What if I miss a dose? If you miss a dose, take it as soon as you can. If it is almost time for your next dose, talk to your doctor or health care professional. You may need to miss a dose or take an extra dose. Do not take double or extra doses without advice. What may interact with this medicine? Do not take this medicine with any of the following medications: -metyrapone -mifepristone This medicine may also interact with the following medications: -aminoglutethimide -amphotericin B -aspirin and aspirin-like medicines -barbiturates -certain medicines for diabetes, like glipizide or glyburide -cholestyramine -cholinesterase inhibitors -cyclosporine -digoxin -diuretics -ephedrine -female hormones, like estrogens and birth control pills -isoniazid -ketoconazole -NSAIDS, medicines for pain and inflammation, like ibuprofen or naproxen -phenytoin -rifampin -toxoids -vaccines -warfarin This list may not describe all possible interactions. Give your health care provider a list of all the medicines, herbs, non-prescription drugs, or dietary supplements you use. Also tell them if you smoke, drink alcohol, or use illegal drugs. Some items may interact with your medicine. What should I watch for while using this medicine? Visit your doctor or health care professional for regular checks on your progress. If you are taking this medicine over a prolonged period, carry an identification card with your name and address, the type and dose of your medicine, and your doctor's name and address. This medicine may increase your risk of getting an  infection. Tell your doctor or health care professional if you are around anyone with measles or chickenpox, or if you develop sores or blisters that do not heal properly. If you are going to have surgery, tell your doctor or health care professional that you have taken this medicine within the last twelve months. Ask your doctor or health care professional about your diet. You may need to lower the amount of salt you eat. This medicine may affect blood sugar levels. If you have diabetes, check with your doctor or health care professional before you change your diet or the dose of your diabetic medicine. What side effects may I notice from receiving this medicine? Side effects that you should report to your doctor or health care professional as soon as possible: -allergic reactions like skin rash, itching or hives, swelling of the face, lips, or tongue -changes in emotions or moods -changes in vision -depressed mood -eye pain -fever or chills, cough, sore throat, pain or difficulty passing urine -  increased thirst -swelling of ankles, feet Side effects that usually do not require medical attention (report to your doctor or health care professional if they continue or are bothersome): -confusion, excitement, restlessness -headache -nausea, vomiting -skin problems, acne, thin and shiny skin -trouble sleeping -weight gain This list may not describe all possible side effects. Call your doctor for medical advice about side effects. You may report side effects to FDA at 1-800-FDA-1088. Where should I keep my medicine? Keep out of the reach of children. Store at room temperature between 15 and 30 degrees C (59 and 86 degrees F). Protect from light. Keep container tightly closed. Throw away any unused medicine after the expiration date. NOTE: This sheet is a summary. It may not cover all possible information. If you have questions about this medicine, talk to your doctor, pharmacist, or health care  provider.  2015, [employer] Standard. (2011-04-04 10:57:14)

## 2015-03-19 NOTE — ED Notes (Signed)
Pt states that she doesn't feel better, that her chest still feels tight. Preston FleetingGlick, MD notified.

## 2015-03-19 NOTE — ED Notes (Signed)
Pt states that she is not feeling better, that her chest still feels tight and that she feels like she still has to fight to get a breath in.

## 2015-03-19 NOTE — ED Provider Notes (Signed)
CSN: [id number]     Arrival date & time 03/19/15  0009 History   This chart was scribed for Dione Boozeavid Tamesha Ellerbrock, MD by Abel PrestoKara Demonbreun, ED Scribe. This patient was seen in room A11C/A11C and the patient's care was started at 1:02 AM.    Chief Complaint  Patient presents with  . inhalers not working      The history is provided by the patient. No language interpreter was used.   HPI Comments: Judith LevyChelsea Hansen is a 24 y.o. female with PMHx of asthma, ADD, and anxiety who presents to the Emergency Department complaining of worsening SOB with onset 1 week ago. She notes associated cough, mild hemoptysis, diarrhea, "white sores" in back of throat, sore throat, congestion, and chest pain "because I'm fighting to breathe".  Pt reports inhaler does not give relief. Pt uses a ProAir inhaler. Pt denies fever and chills.   Past Medical History  Diagnosis Date  . Anxiety   . Asthma   . Depression   . ADD (attention deficit disorder)    Past Surgical History  Procedure Laterality Date  . Wisdom tooth extraction    . Orif forearm fracture Right   . Fracture surgery  2004    rt arm has plate   Family History  Problem Relation Age of Onset  . Alcohol abuse Mother   . Arthritis Mother   . Depression Mother   . Heart disease Maternal Grandfather    History  Substance Use Topics  . Smoking status: Current Every Day Smoker -- 0.50 packs/day    Types: Cigarettes  . Smokeless tobacco: Current User  . Alcohol Use: 1.1 oz/week    1 Cans of beer, 1 Standard drinks or equivalent per week   OB History    No data available     Review of Systems  Constitutional: Negative for fever and chills.  HENT: Positive for sore throat.   Respiratory: Positive for cough and shortness of breath.   Cardiovascular: Positive for chest pain.  Gastrointestinal: Positive for diarrhea.  All other systems reviewed and are negative.     Allergies  Review of patient's allergies indicates no known allergies.  Home  Medications   Prior to Admission medications   Medication Sig Start Date End Date Taking? Authorizing Provider  albuterol (PROVENTIL HFA;VENTOLIN HFA) 108 (90 BASE) MCG/ACT inhaler Inhale 2 puffs into the lungs every 6 (six) hours as needed for wheezing or shortness of breath. 03/28/14   Dorena BodoMary B Dixon, PA-C  beclomethasone (QVAR) 80 MCG/ACT inhaler Inhale 1 puff into the lungs 2 (two) times daily. 03/28/14   Patriciaann ClanMary B Dixon, PA-C  DULoxetine (CYMBALTA) 60 MG capsule Take 60 mg by mouth at bedtime.     Historical Provider, MD  gabapentin (NEURONTIN) 100 MG capsule Take 100 mg by mouth 2 (two) times daily.     Historical Provider, MD  ibuprofen (ADVIL,MOTRIN) 800 MG tablet Take 800 mg by mouth every 8 (eight) hours as needed.    Historical Provider, MD  LamoTRIgine 50 MG TBDP Take 25 mg by mouth at bedtime.    Historical Provider, MD   BP 139/67 mmHg  Pulse 80  Temp(Src) 97.9 F (36.6 C) (Oral)  Resp 16  SpO2 95% Physical Exam  Constitutional: She is oriented to person, place, and time. She appears well-developed and well-nourished.  HENT:  Head: Normocephalic and atraumatic.  Mouth/Throat: Posterior oropharyngeal erythema (mild) present. No oropharyngeal exudate.  Eyes: EOM are normal. Pupils are equal, round, and reactive to light.  Neck: Normal range of motion. Neck supple. No JVD present.  Cardiovascular: Normal rate, regular rhythm and normal heart sounds.  Exam reveals no friction rub.   No murmur heard. Pulmonary/Chest: Effort normal. No respiratory distress. She has wheezes (diffuse inspiratory and expiratory). She has no rales. She exhibits no tenderness.  Abdominal: Soft. Bowel sounds are normal. She exhibits no distension and no mass. There is no tenderness.  Musculoskeletal: Normal range of motion. She exhibits no edema.  Lymphadenopathy:    She has no cervical adenopathy.  Neurological: She is alert and oriented to person, place, and time. No cranial nerve deficit. She exhibits  normal muscle tone. Coordination normal.  Skin: Skin is warm and dry. No rash noted.  Psychiatric: She has a normal mood and affect. Her behavior is normal. Judgment and thought content normal.  Nursing note and vitals reviewed.   ED Course  Procedures (including critical care time) DIAGNOSTIC STUDIES: Oxygen Saturation is 95% on room air, normal by my interpretation.    COORDINATION OF CARE: 1:08 AM Discussed treatment plan with patient at beside, the patient agrees with the plan and has no further questions at this time.   Labs Review Results for orders placed or performed during the hospital encounter of 03/19/15  Rapid strep screen  Result Value Ref Range   Streptococcus, Group A Screen (Direct) NEGATIVE NEGATIVE  Basic metabolic panel  Result Value Ref Range   Sodium 138 135 - 145 mmol/L   Potassium 3.4 (L) 3.5 - 5.1 mmol/L   Chloride 105 101 - 111 mmol/L   CO2 24 22 - 32 mmol/L   Glucose, Bld 121 (H) 65 - 99 mg/dL   BUN 6 6 - 20 mg/dL   Creatinine, Ser 4.69 0.44 - 1.00 mg/dL   Calcium 9.2 8.9 - 62.9 mg/dL   GFR calc non Af Amer >60 >60 mL/min   GFR calc Af Amer >60 >60 mL/min   Anion gap 9 5 - 15  CBC with Differential  Result Value Ref Range   WBC 10.7 (H) 4.0 - 10.5 K/uL   RBC 3.92 3.87 - 5.11 MIL/uL   Hemoglobin 12.1 12.0 - 15.0 g/dL   HCT 52.8 41.3 - 24.4 %   MCV 92.9 78.0 - 100.0 fL   MCH 30.9 26.0 - 34.0 pg   MCHC 33.2 30.0 - 36.0 g/dL   RDW 01.0 27.2 - 53.6 %   Platelets 293 150 - 400 K/uL   Neutrophils Relative % 51 43 - 77 %   Neutro Abs 5.5 1.7 - 7.7 K/uL   Lymphocytes Relative 33 12 - 46 %   Lymphs Abs 3.5 0.7 - 4.0 K/uL   Monocytes Relative 8 3 - 12 %   Monocytes Absolute 0.9 0.1 - 1.0 K/uL   Eosinophils Relative 8 (H) 0 - 5 %   Eosinophils Absolute 0.8 (H) 0.0 - 0.7 K/uL   Basophils Relative 0 0 - 1 %   Basophils Absolute 0.0 0.0 - 0.1 K/uL   Imaging Review Dg Chest 2 View  03/19/2015   CLINICAL DATA:  24 year old female with shortness of  breath and cough  EXAM: CHEST  2 VIEW  COMPARISON:  None  FINDINGS: The heart size and mediastinal contours are within normal limits. Both lungs are clear. The visualized skeletal structures are unremarkable.  IMPRESSION: No active cardiopulmonary disease.   Electronically Signed   By: Elgie Collard M.D.   On: 03/19/2015 01:11    Images viewed by me.  MDM   Final diagnoses:  Asthma exacerbation    Exacerbation of asthma which was likely triggered by viral illness. Chest x-ray does not show any infiltrate and she is afebrile. She will be given nebulizer treatment with albuterol and ipratropium and started on steroids. Strep screen will be obtained.   After initial nebulizer treatment, she did not have a subjective improvement but she seemed to be moving air more effectively. After second nebulizer treatment, wheezing started to diminish. After third nebulizer treatment, she still had significant wheezing and thought was that she may need to be admitted. Screening labs were obtained at that point she was given a fourth nebulizer treatment. Following fourth nebulizer treatment, wheezing is almost completely gone and she is resting comfortably. At this point, I do not feel she needs hospital admission. Patient is in agreement with this. She is given an albuterol inhaler to take home and is given a prescription for prednisone but advised to return and follow symptoms are worsening. Follow-up with PCP in 5 days. Of note, she is a cigarette smoker but states that she has stopped smoking as of tonight. She is encouraged to follow through with that promise.  I personally performed the services described in this documentation, which was scribed in my presence. The recorded information has been reviewed and is accurate.       Dione Booze, MD 03/19/15 [phone]

## 2015-03-19 NOTE — ED Notes (Signed)
The pt has had difficulty breathing for one week .  No difficulty breathing at present.  Her inhalers are not working she reports that she coughed up bright red blood earlier today she has sores in her mouth  lmp one month ago

## 2015-03-19 NOTE — ED Notes (Signed)
Pt taken to xray 

## 2015-03-19 NOTE — ED Notes (Signed)
Pt states that she is feeling a little bit better after her second breathing treatment.

## 2015-03-22 LAB — CULTURE, GROUP A STREP: Strep A Culture: NEGATIVE

## 2015-04-08 ENCOUNTER — Encounter (HOSPITAL_COMMUNITY): Payer: Self-pay | Admitting: Family Medicine

## 2015-04-08 ENCOUNTER — Emergency Department (HOSPITAL_COMMUNITY): Payer: Self-pay

## 2015-04-08 ENCOUNTER — Emergency Department (HOSPITAL_COMMUNITY)
Admission: EM | Admit: 2015-04-08 | Discharge: 2015-04-08 | Disposition: A | Discharge status: Home / Self Care | Payer: Self-pay | Attending: Emergency Medicine | Admitting: Emergency Medicine

## 2015-04-08 DIAGNOSIS — F329 Major depressive disorder, single episode, unspecified: Secondary | ICD-10-CM | POA: Insufficient documentation

## 2015-04-08 DIAGNOSIS — J45901 Unspecified asthma with (acute) exacerbation: Secondary | ICD-10-CM | POA: Insufficient documentation

## 2015-04-08 DIAGNOSIS — F419 Anxiety disorder, unspecified: Secondary | ICD-10-CM | POA: Insufficient documentation

## 2015-04-08 DIAGNOSIS — R079 Chest pain, unspecified: Secondary | ICD-10-CM | POA: Insufficient documentation

## 2015-04-08 DIAGNOSIS — Z87891 Personal history of nicotine dependence: Secondary | ICD-10-CM | POA: Insufficient documentation

## 2015-04-08 DIAGNOSIS — Z79899 Other long term (current) drug therapy: Secondary | ICD-10-CM | POA: Insufficient documentation

## 2015-04-08 MED ORDER — ACETAMINOPHEN 500 MG PO TABS
1000.0000 mg | ORAL_TABLET | Freq: Once | ORAL | Status: AC
  Administered 2015-04-08: 1000 mg via ORAL
  Filled 2015-04-08: qty 2

## 2015-04-08 MED ORDER — PREDNISONE 20 MG PO TABS: 60.0000 mg | ORAL_TABLET | Freq: Every day | ORAL | Status: AC

## 2015-04-08 MED ORDER — ALBUTEROL SULFATE HFA 108 (90 BASE) MCG/ACT IN AERS
2.0000 | INHALATION_SPRAY | Freq: Once | RESPIRATORY_TRACT | Status: AC
  Administered 2015-04-08: 2 via RESPIRATORY_TRACT
  Filled 2015-04-08: qty 6.7

## 2015-04-08 MED ORDER — ALBUTEROL SULFATE (2.5 MG/3ML) 0.083% IN NEBU
5.0000 mg | INHALATION_SOLUTION | Freq: Once | RESPIRATORY_TRACT | Status: AC
  Administered 2015-04-08: 5 mg via RESPIRATORY_TRACT
  Filled 2015-04-08: qty 6

## 2015-04-08 MED ORDER — ALBUTEROL SULFATE HFA 108 (90 BASE) MCG/ACT IN AERS: 2.0000 | INHALATION_SPRAY | RESPIRATORY_TRACT | Status: DC | PRN

## 2015-04-08 MED ORDER — PREDNISONE 20 MG PO TABS
60.0000 mg | ORAL_TABLET | Freq: Once | ORAL | Status: AC
  Administered 2015-04-08: 60 mg via ORAL
  Filled 2015-04-08: qty 3

## 2015-04-08 MED ORDER — ALBUTEROL (5 MG/ML) CONTINUOUS INHALATION SOLN: 10.0000 mg/h | INHALATION_SOLUTION | Freq: Once | RESPIRATORY_TRACT | Status: DC

## 2015-04-08 NOTE — ED Provider Notes (Signed)
CSN: [id number]     Arrival date & time 04/08/15  1633 History   First MD Initiated Contact with Patient 04/08/15 1651     Chief Complaint  Patient presents with  . Asthma     (Consider location/radiation/quality/duration/timing/severity/associated sxs/prior Treatment) HPI  24 year old female with a history of asthma presents with worsening shortness of breath. She states that ever since the summer started a couple months ago she's been having daily shortness of breath and has used her inhaler multiple times per day. However around 1 PM today her shortness of breath significant worsened. She has a dry cough but no fevers or chills. Is having congestion and symptoms of allergies as well. Hears wheezing and has sharp chest pain. All of her symptoms are typical of her asthma exacerbations except the chest pain is typically not there. She ran out of her inhaler and states she cannot fill it due to monetary issues but was using a friend's inhaler with mild relief.  Past Medical History  Diagnosis Date  . Anxiety   . Asthma   . Depression   . ADD (attention deficit disorder)    Past Surgical History  Procedure Laterality Date  . Wisdom tooth extraction    . Orif forearm fracture Right   . Fracture surgery  2004    rt arm has plate   Family History  Problem Relation Age of Onset  . Alcohol abuse Mother   . Arthritis Mother   . Depression Mother   . Heart disease Maternal Grandfather    History  Substance Use Topics  . Smoking status: Former Smoker -- 0.00 packs/day    Quit date: 03/18/2015  . Smokeless tobacco: Current User  . Alcohol Use: 1.1 oz/week    1 Cans of beer, 1 Standard drinks or equivalent per week   OB History    No data available     Review of Systems  Constitutional: Negative for fever.  HENT: Positive for congestion and sneezing.   Respiratory: Positive for cough, chest tightness, shortness of breath and wheezing.   Cardiovascular: Positive for chest pain.   All other systems reviewed and are negative.     Allergies  Review of patient's allergies indicates no known allergies.  Home Medications   Prior to Admission medications   Medication Sig Start Date End Date Taking? Authorizing Provider  albuterol (PROVENTIL HFA;VENTOLIN HFA) 108 (90 BASE) MCG/ACT inhaler Inhale 2 puffs into the lungs every 6 (six) hours as needed for wheezing or shortness of breath. Patient not taking: Reported on 03/19/2015 03/28/14   Dorena Bodo, PA-C  beclomethasone (QVAR) 80 MCG/ACT inhaler Inhale 1 puff into the lungs 2 (two) times daily. Patient not taking: Reported on 03/19/2015 03/28/14   Dorena Bodo, PA-C  DULoxetine (CYMBALTA) 60 MG capsule Take 60 mg by mouth at bedtime.     Historical Provider, MD  gabapentin (NEURONTIN) 100 MG capsule Take 100 mg by mouth 2 (two) times daily.     Historical Provider, MD  LamoTRIgine 50 MG TBDP Take 25 mg by mouth at bedtime.    Historical Provider, MD  predniSONE (DELTASONE) 20 MG tablet Take 3 tablets (60 mg total) by mouth daily. 03/19/15   Dione Booze, MD   BP 121/73 mmHg  Pulse 83  Temp(Src) 97.8 F (36.6 C)  Resp 25  SpO2 93%  LMP 03/25/2015 Physical Exam  Constitutional: She is oriented to person, place, and time. She appears well-developed and well-nourished. No distress.  Speaks in full  sentences  HENT:  Head: Normocephalic and atraumatic.  Right Ear: External ear normal.  Left Ear: External ear normal.  Nose: Nose normal.  Eyes: Right eye exhibits no discharge. Left eye exhibits no discharge.  Cardiovascular: Normal rate, regular rhythm and normal heart sounds.   Pulmonary/Chest: Effort normal. She has wheezes.  Abdominal: Soft. There is no tenderness.  Neurological: She is alert and oriented to person, place, and time.  Skin: Skin is warm and dry. She is not diaphoretic.  Nursing note and vitals reviewed.   ED Course  Procedures (including critical care time) Labs Review Labs Reviewed - No data  to display  Imaging Review Dg Chest 2 View  04/08/2015   CLINICAL DATA:  Shortness of breath, chest and back pain. Pt states she had an asthma attack today and did not have an inhaler. Hx asthma  EXAM: CHEST  2 VIEW  COMPARISON:  03/19/2015  FINDINGS: The heart size and mediastinal contours are within normal limits. Both lungs are clear. No pleural effusion or pneumothorax. The visualized skeletal structures are unremarkable.  IMPRESSION: No active cardiopulmonary disease.   Electronically Signed   By: Amie Portland M.D.   On: 04/08/2015 18:57     EKG Interpretation   Date/Time:  Saturday April 08 2015 17:24:37 EDT Ventricular Rate:  82 PR Interval:  150 QRS Duration: 86 QT Interval:  384 QTC Calculation: 448 R Axis:   50 Text Interpretation:  Sinus rhythm Low voltage, precordial leads no  significant change since 2001 Confirmed by Irving Bloor  MD, Coulton Schlink ([id number]) on  04/08/2015 6:19:07 PM      MDM   Final diagnoses:  Asthma exacerbation    Patient feels much better with albuterol neb here. Her chest pain is most likely chest wall given chronic cough, reproducible tenderness and benign ECG. Will refill inhaler and give steroid burst but at this time no indication for antibiotics or more intensive treatment or admission.     Pricilla Loveless, MD 04/09/15 1051

## 2015-04-08 NOTE — ED Notes (Signed)
Pt here for SOB due to asthma. sts she doesn't have an inhaler. sts she used her moms inhaler and it helped.

## 2015-04-08 NOTE — Discharge Instructions (Signed)
Asthma, Acute Bronchospasm °Acute bronchospasm caused by asthma is also referred to as an asthma attack. Bronchospasm means your air passages become narrowed. The narrowing is caused by inflammation and tightening of the muscles in the air tubes (bronchi) in your lungs. This can make it hard to breathe or cause you to wheeze and cough. °CAUSES °Possible triggers are: °· Animal dander from the skin, hair, or feathers of animals. °· Dust mites contained in house dust. °· Cockroaches. °· Pollen from trees or grass. °· Mold. °· Cigarette or tobacco smoke. °· Air pollutants such as dust, household cleaners, hair sprays, aerosol sprays, paint fumes, strong chemicals, or strong odors. °· Cold air or weather changes. Cold air may trigger inflammation. Winds increase molds and pollens in the air. °· Strong emotions such as crying or laughing hard. °· Stress. °· Certain medicines such as aspirin or beta-blockers. °· Sulfites in foods and drinks, such as dried fruits and wine. °· Infections or inflammatory conditions, such as a flu, cold, or inflammation of the nasal membranes (rhinitis). °· Gastroesophageal reflux disease (GERD). GERD is a condition where stomach acid backs up into your esophagus. °· Exercise or strenuous activity. °SIGNS AND SYMPTOMS  °· Wheezing. °· Excessive coughing, particularly at night. °· Chest tightness. °· Shortness of breath. °DIAGNOSIS  °Your health care provider will ask you about your medical history and perform a physical exam. A chest X-ray or blood testing may be performed to look for other causes of your symptoms or other conditions that may have triggered your asthma attack.  °TREATMENT  °Treatment is aimed at reducing inflammation and opening up the airways in your lungs.  Most asthma attacks are treated with inhaled medicines. These include quick relief or rescue medicines (such as bronchodilators) and controller medicines (such as inhaled corticosteroids). These medicines are sometimes  given through an inhaler or a nebulizer. Systemic steroid medicine taken by mouth or given through an IV tube also can be used to reduce the inflammation when an attack is moderate or severe. Antibiotic medicines are only used if a bacterial infection is present.  °HOME CARE INSTRUCTIONS  °· Rest. °· Drink plenty of liquids. This helps the mucus to remain thin and be easily coughed up. Only use caffeine in moderation and do not use alcohol until you have recovered from your illness. °· Do not smoke. Avoid being exposed to secondhand smoke. °· You play a critical role in keeping yourself in good health. Avoid exposure to things that cause you to wheeze or to have breathing problems. °· Keep your medicines up-to-date and available. Carefully follow your health care provider's treatment plan. °· Take your medicine exactly as prescribed. °· When pollen or pollution is bad, keep windows closed and use an air conditioner or go to places with air conditioning. °· Asthma requires careful medical care. See your health care provider for a follow-up as advised. If you are more than [redacted] weeks pregnant and you were prescribed any new medicines, let your obstetrician know about the visit and how you are doing. Follow up with your health care provider as directed. °· After you have recovered from your asthma attack, make an appointment with your outpatient doctor to talk about ways to reduce the likelihood of future attacks. If you do not have a doctor who manages your asthma, make an appointment with a primary care doctor to discuss your asthma. °SEEK IMMEDIATE MEDICAL CARE IF:  °· You are getting worse. °· You have trouble breathing. If severe, call your local   emergency services (911 in the [place] °· You develop chest pain or discomfort. °· You are vomiting. °· You are not able to keep fluids down. °· You are coughing up yellow, green, brown, or bloody sputum. °· You have a fever and your symptoms suddenly get worse. °· You have  trouble swallowing. °MAKE SURE YOU:  °· Understand these instructions. °· Will watch your condition. °· Will get help right away if you are not doing well or get worse. °Document Released: 12/04/2006 Document Revised: 08/24/2013 Document Reviewed: 02/24/2013 °ExitCare® Patient Information ©2015 [employer]. This information is not intended to replace advice given to you by your health care provider. Make sure you discuss any questions you have with your health care provider. ° °Chest Wall Pain °Chest wall pain is pain in or around the bones and muscles of your chest. It may take up to 6 weeks to get better. It may take longer if you must stay physically active in your work and activities.  °CAUSES  °Chest wall pain may happen on its own. However, it may be caused by: °· A viral illness like the flu. °· Injury. °· Coughing. °· Exercise. °· Arthritis. °· Fibromyalgia. °· Shingles. °HOME CARE INSTRUCTIONS  °· Avoid overtiring physical activity. Try not to strain or perform activities that cause pain. This includes any activities using your chest or your abdominal and side muscles, especially if heavy weights are used. °· Put ice on the sore area. °¨ Put ice in a plastic bag. °¨ Place a towel between your skin and the bag. °¨ Leave the ice on for 15-20 minutes per hour while awake for the first 2 days. °· Only take over-the-counter or prescription medicines for pain, discomfort, or fever as directed by your caregiver. °SEEK IMMEDIATE MEDICAL CARE IF:  °· Your pain increases, or you are very uncomfortable. °· You have a fever. °· Your chest pain becomes worse. °· You have new, unexplained symptoms. °· You have nausea or vomiting. °· You feel sweaty or lightheaded. °· You have a cough with phlegm (sputum), or you cough up blood. °MAKE SURE YOU:  °· Understand these instructions. °· Will watch your condition. °· Will get help right away if you are not doing well or get worse. °Document Released: 08/19/2005 Document  Revised: 11/11/2011 Document Reviewed: 04/15/2011 °ExitCare® Patient Information ©2015 [employer]. This information is not intended to replace advice given to you by your health care provider. Make sure you discuss any questions you have with your health care provider. ° °

## 2015-07-08 ENCOUNTER — Emergency Department (HOSPITAL_COMMUNITY): Payer: Self-pay

## 2015-07-08 ENCOUNTER — Encounter (HOSPITAL_COMMUNITY): Payer: Self-pay

## 2015-07-08 ENCOUNTER — Emergency Department (HOSPITAL_COMMUNITY)
Admission: EM | Admit: 2015-07-08 | Discharge: 2015-07-08 | Disposition: A | Discharge status: Home / Self Care | Payer: Self-pay | Attending: Emergency Medicine | Admitting: Emergency Medicine

## 2015-07-08 DIAGNOSIS — Z79899 Other long term (current) drug therapy: Secondary | ICD-10-CM | POA: Insufficient documentation

## 2015-07-08 DIAGNOSIS — Z87891 Personal history of nicotine dependence: Secondary | ICD-10-CM | POA: Insufficient documentation

## 2015-07-08 DIAGNOSIS — F329 Major depressive disorder, single episode, unspecified: Secondary | ICD-10-CM | POA: Insufficient documentation

## 2015-07-08 DIAGNOSIS — J45901 Unspecified asthma with (acute) exacerbation: Secondary | ICD-10-CM | POA: Insufficient documentation

## 2015-07-08 DIAGNOSIS — F419 Anxiety disorder, unspecified: Secondary | ICD-10-CM | POA: Insufficient documentation

## 2015-07-08 MED ORDER — ALBUTEROL SULFATE HFA 108 (90 BASE) MCG/ACT IN AERS
2.0000 | INHALATION_SPRAY | Freq: Once | RESPIRATORY_TRACT | Status: AC
  Administered 2015-07-08: 2 via RESPIRATORY_TRACT
  Filled 2015-07-08: qty 6.7

## 2015-07-08 MED ORDER — PREDNISONE 20 MG PO TABS: 60.0000 mg | ORAL_TABLET | Freq: Every day | ORAL | Status: DC

## 2015-07-08 MED ORDER — ALBUTEROL SULFATE (2.5 MG/3ML) 0.083% IN NEBU
5.0000 mg | INHALATION_SOLUTION | Freq: Once | RESPIRATORY_TRACT | Status: AC
  Administered 2015-07-08: 5 mg via RESPIRATORY_TRACT
  Filled 2015-07-08: qty 6

## 2015-07-08 MED ORDER — ALBUTEROL SULFATE HFA 108 (90 BASE) MCG/ACT IN AERS: 1.0000 | INHALATION_SPRAY | Freq: Four times a day (QID) | RESPIRATORY_TRACT | Status: DC | PRN

## 2015-07-08 NOTE — Discharge Instructions (Signed)
Asthma, Adult Asthma is a recurring condition in which the airways tighten and narrow. Asthma can make it difficult to breathe. It can cause coughing, wheezing, and shortness of breath. Asthma episodes, also called asthma attacks, range from minor to life-threatening. Asthma cannot be cured, but medicines and lifestyle changes can help control it. CAUSES Asthma is believed to be caused by inherited (genetic) and environmental factors, but its exact cause is unknown. Asthma may be triggered by allergens, lung infections, or irritants in the air. Asthma triggers are different for each person. Common triggers include:   Animal dander.  Dust mites.  Cockroaches.  Pollen from trees or grass.  Mold.  Smoke.  Air pollutants such as dust, household cleaners, hair sprays, aerosol sprays, paint fumes, strong chemicals, or strong odors.  Cold air, weather changes, and winds (which increase molds and pollens in the air).  Strong emotional expressions such as crying or laughing hard.  Stress.  Certain medicines (such as aspirin) or types of drugs (such as beta-blockers).  Sulfites in foods and drinks. Foods and drinks that may contain sulfites include dried fruit, potato chips, and sparkling grape juice.  Infections or inflammatory conditions such as the flu, a cold, or an inflammation of the nasal membranes (rhinitis).  Gastroesophageal reflux disease (GERD).  Exercise or strenuous activity. SYMPTOMS Symptoms may occur immediately after asthma is triggered or many hours later. Symptoms include:  Wheezing.  Excessive nighttime or early morning coughing.  Frequent or severe coughing with a common cold.  Chest tightness.  Shortness of breath. DIAGNOSIS  The diagnosis of asthma is made by a review of your medical history and a physical exam. Tests may also be performed. These may include:  Lung function studies. These tests show how much air you breathe in and out.  Allergy  tests.  Imaging tests such as X-rays. TREATMENT  Asthma cannot be cured, but it can usually be controlled. Treatment involves identifying and avoiding your asthma triggers. It also involves medicines. There are 2 classes of medicine used for asthma treatment:   Controller medicines. These prevent asthma symptoms from occurring. They are usually taken every day.  Reliever or rescue medicines. These quickly relieve asthma symptoms. They are used as needed and provide short-term relief. Your health care provider will help you create an asthma action plan. An asthma action plan is a written plan for managing and treating your asthma attacks. It includes a list of your asthma triggers and how they may be avoided. It also includes information on when medicines should be taken and when their dosage should be changed. An action plan may also involve the use of a device called a peak flow meter. A peak flow meter measures how well the lungs are working. It helps you monitor your condition. HOME CARE INSTRUCTIONS   Take medicines only as directed by your health care provider. Speak with your health care provider if you have questions about how or when to take the medicines.  Use a peak flow meter as directed by your health care provider. Record and keep track of readings.  Understand and use the action plan to help minimize or stop an asthma attack without needing to seek medical care.  Control your home environment in the following ways to help prevent asthma attacks:  Do not smoke. Avoid being exposed to secondhand smoke.  Change your heating and air conditioning filter regularly.  Limit your use of fireplaces and wood stoves.  Get rid of pests (such as roaches   and mice) and their droppings.  Throw away plants if you see mold on them.  Clean your floors and dust regularly. Use unscented cleaning products.  Try to have someone else vacuum for you regularly. Stay out of rooms while they are  being vacuumed and for a short while afterward. If you vacuum, use a dust mask from a hardware store, a double-layered or microfilter vacuum cleaner bag, or a vacuum cleaner with a HEPA filter.  Replace carpet with wood, tile, or vinyl flooring. Carpet can trap dander and dust.  Use allergy-proof pillows, mattress covers, and box spring covers.  Wash bed sheets and blankets every week in hot water and dry them in a dryer.  Use blankets that are made of polyester or cotton.  Clean bathrooms and kitchens with bleach. If possible, have someone repaint the walls in these rooms with mold-resistant paint. Keep out of the rooms that are being cleaned and painted.  Wash hands frequently. SEEK MEDICAL CARE IF:   You have wheezing, shortness of breath, or a cough even if taking medicine to prevent attacks.  The colored mucus you cough up (sputum) is thicker than usual.  Your sputum changes from clear or white to yellow, green, gray, or bloody.  You have any problems that may be related to the medicines you are taking (such as a rash, itching, swelling, or trouble breathing).  You are using a reliever medicine more than 2-3 times per week.  Your peak flow is still at 50-79% of your personal best after following your action plan for 1 hour.  You have a fever. SEEK IMMEDIATE MEDICAL CARE IF:   You seem to be getting worse and are unresponsive to treatment during an asthma attack.  You are short of breath even at rest.  You get short of breath when doing very little physical activity.  You have difficulty eating, drinking, or talking due to asthma symptoms.  You develop chest pain.  You develop a fast heartbeat.  You have a bluish color to your lips or fingernails.  You are light-headed, dizzy, or faint.  Your peak flow is less than 50% of your personal best.   This information is not intended to replace advice given to you by your health care provider. Make sure you discuss any  questions you have with your health care provider.   Document Released: 08/19/2005 Document Revised: 05/10/2015 Document Reviewed: 03/18/2013 Elsevier Interactive Patient Education 2016 [employer]  

## 2015-07-08 NOTE — ED Provider Notes (Signed)
CSN: [id number]     Arrival date & time 07/08/15  7829 History   First MD Initiated Contact with Patient 07/08/15 [phone]     Chief Complaint  Patient presents with  . Wheezing     (Consider location/radiation/quality/duration/timing/severity/associated sxs/prior Treatment) Patient is a 24 y.o. female presenting with wheezing.  Wheezing Severity:  Moderate Severity compared to prior episodes:  Similar Onset quality:  Gradual Duration:  3 days Timing:  Constant Progression:  Worsening Chronicity:  New Relieved by:  Nothing Worsened by:  Nothing tried Associated symptoms: chest tightness, cough (yellow productive) and shortness of breath   Associated symptoms: no chest pain and no rash     Past Medical History  Diagnosis Date  . Anxiety   . Asthma   . Depression   . ADD (attention deficit disorder)    Past Surgical History  Procedure Laterality Date  . Wisdom tooth extraction    . Orif forearm fracture Right   . Fracture surgery  2004    rt arm has plate   Family History  Problem Relation Age of Onset  . Alcohol abuse Mother   . Arthritis Mother   . Depression Mother   . Heart disease Maternal Grandfather    Social History  Substance Use Topics  . Smoking status: Former Smoker -- 0.00 packs/day    Quit date: 03/18/2015  . Smokeless tobacco: Current User  . Alcohol Use: 1.1 oz/week    1 Cans of beer, 1 Standard drinks or equivalent per week   OB History    No data available     Review of Systems  Respiratory: Positive for cough (yellow productive), chest tightness, shortness of breath and wheezing.   Cardiovascular: Negative for chest pain.  Skin: Negative for rash.  All other systems reviewed and are negative.     Allergies  Review of patient's allergies indicates no known allergies.  Home Medications   Prior to Admission medications   Medication Sig Start Date End Date Taking? Authorizing Provider  DULoxetine (CYMBALTA) 60 MG capsule Take 60 mg by  mouth at bedtime.    Yes Historical Provider, MD  LamoTRIgine 50 MG TBDP Take 25 mg by mouth at bedtime.   Yes Historical Provider, MD  albuterol (PROVENTIL HFA;VENTOLIN HFA) 108 (90 BASE) MCG/ACT inhaler Inhale 1-2 puffs into the lungs every 6 (six) hours as needed for wheezing or shortness of breath. 07/08/15   Mirian Mo, MD  beclomethasone (QVAR) 80 MCG/ACT inhaler Inhale 1 puff into the lungs 2 (two) times daily. Patient not taking: Reported on 03/19/2015 03/28/14   Dorena Bodo, PA-C  predniSONE (DELTASONE) 20 MG tablet Take 3 tablets (60 mg total) by mouth daily. 07/08/15   Mirian Mo, MD   BP 117/58 mmHg  Pulse 92  Temp(Src) 97.9 F (36.6 C) (Oral)  Resp 26  Ht  (1.651 m)  Wt 240 lb (108.863 kg)  BMI 39.94 kg/m2  SpO2 98%  LMP 06/26/2015 Physical Exam  Constitutional: She is oriented to person, place, and time. She appears well-developed and well-nourished.  HENT:  Head: Normocephalic and atraumatic.  Right Ear: External ear normal.  Left Ear: External ear normal.  Eyes: Conjunctivae and EOM are normal. Pupils are equal, round, and reactive to light.  Neck: Normal range of motion. Neck supple.  Cardiovascular: Normal rate, regular rhythm, normal heart sounds and intact distal pulses.   Pulmonary/Chest: Effort normal. She has wheezes (diffuse).  Abdominal: Soft. Bowel sounds are normal. There is no tenderness.  Musculoskeletal: Normal range of motion.  Neurological: She is alert and oriented to person, place, and time.  Skin: Skin is warm and dry.  Vitals reviewed.   ED Course  Procedures (including critical care time) Labs Review Labs Reviewed - No data to display  Imaging Review Dg Chest 2 View  07/08/2015  CLINICAL DATA:  Nonproductive cough. Shortness of breath and wheezing. EXAM: CHEST  2 VIEW COMPARISON:  04/08/2015 FINDINGS: There is slight peribronchial thickening but the lungs are otherwise clear. Heart size and vascularity are normal. No effusions.  No osseous abnormality. IMPRESSION: Slight bronchitic changes. Electronically Signed   By: Francene BoyersJames  Maxwell M.D.   On: 07/08/2015 08:10   I have personally reviewed and evaluated these images and lab results as part of my medical decision-making.   EKG Interpretation None      MDM   Final diagnoses:  Asthma attack    24 y.o. female with pertinent PMH of asthma presents with recurrent dyspnea identical to prior.  Physical exam with wheezing.  Relieved by duoneb.  DC home in stable condition with prednisone and albuterol prescription  I have reviewed all laboratory and imaging studies if ordered as above  1. Asthma attack         Mirian MoMatthew Deondrea Markos, MD 07/08/15 [phone]

## 2015-07-08 NOTE — ED Notes (Signed)
Pt. Coming from home with wheezing and shortness of breath. She has hx of asthma. Pt. Reports that the pharmacy won't fill her albuterol inhaler until tomorrow.

## 2015-12-02 ENCOUNTER — Emergency Department (HOSPITAL_COMMUNITY)
Admission: EM | Admit: 2015-12-02 | Discharge: 2015-12-02 | Disposition: A | Discharge status: Home / Self Care | Payer: Self-pay | Attending: Emergency Medicine | Admitting: Emergency Medicine

## 2015-12-02 ENCOUNTER — Emergency Department (HOSPITAL_COMMUNITY): Payer: Self-pay

## 2015-12-02 ENCOUNTER — Encounter (HOSPITAL_COMMUNITY): Payer: Self-pay | Admitting: Emergency Medicine

## 2015-12-02 DIAGNOSIS — Z79899 Other long term (current) drug therapy: Secondary | ICD-10-CM | POA: Insufficient documentation

## 2015-12-02 DIAGNOSIS — J4521 Mild intermittent asthma with (acute) exacerbation: Secondary | ICD-10-CM | POA: Insufficient documentation

## 2015-12-02 DIAGNOSIS — F329 Major depressive disorder, single episode, unspecified: Secondary | ICD-10-CM | POA: Insufficient documentation

## 2015-12-02 DIAGNOSIS — F419 Anxiety disorder, unspecified: Secondary | ICD-10-CM | POA: Insufficient documentation

## 2015-12-02 DIAGNOSIS — Z87891 Personal history of nicotine dependence: Secondary | ICD-10-CM | POA: Insufficient documentation

## 2015-12-02 MED ORDER — ALBUTEROL SULFATE HFA 108 (90 BASE) MCG/ACT IN AERS
2.0000 | INHALATION_SPRAY | Freq: Once | RESPIRATORY_TRACT | Status: AC
  Administered 2015-12-02: 2 via RESPIRATORY_TRACT
  Filled 2015-12-02: qty 6.7

## 2015-12-02 MED ORDER — DEXAMETHASONE SODIUM PHOSPHATE 10 MG/ML IJ SOLN
10.0000 mg | Freq: Once | INTRAMUSCULAR | Status: AC
  Administered 2015-12-02: 10 mg via INTRAVENOUS
  Filled 2015-12-02: qty 1

## 2015-12-02 MED ORDER — DOXYCYCLINE HYCLATE 100 MG PO TABS: 100.0000 mg | ORAL_TABLET | Freq: Two times a day (BID) | ORAL | Status: AC

## 2015-12-02 MED ORDER — ALBUTEROL SULFATE (2.5 MG/3ML) 0.083% IN NEBU
5.0000 mg | INHALATION_SOLUTION | Freq: Once | RESPIRATORY_TRACT | Status: AC
  Administered 2015-12-02: 5 mg via RESPIRATORY_TRACT
  Filled 2015-12-02: qty 6

## 2015-12-02 MED ORDER — ALBUTEROL SULFATE HFA 108 (90 BASE) MCG/ACT IN AERS: 1.0000 | INHALATION_SPRAY | Freq: Four times a day (QID) | RESPIRATORY_TRACT | Status: AC | PRN

## 2015-12-02 NOTE — ED Notes (Signed)
Patient transported to X-ray 

## 2015-12-02 NOTE — ED Notes (Signed)
Pt arrives by POV with c/o SOB. Pt had episode last night, called EMS. They gave breathing treatment and pt felt better. Went to sleep and woke up this morning with same symptoms. Pt has hx of asthma but is not currently medicated.

## 2015-12-02 NOTE — ED Notes (Signed)
MD at bedside. 

## 2015-12-02 NOTE — ED Provider Notes (Signed)
CSN: [id number]     Arrival date & time 12/02/15  0620 History   First MD Initiated Contact with Patient 12/02/15 [phone]     Chief Complaint  Patient presents with  . Shortness of Breath     (Consider location/radiation/quality/duration/timing/severity/associated sxs/prior Treatment) HPI  Judith Hansen is a 25 year old lady with PMH of Asthma who presents with shortness of breath which woke her from sleep around 4 am. She had associated wheezing and cough with occasional production of "lime-green" sputum. She also reports chills, sweats, and chest tenderness from coughing. She denies fevers, myalgias, She says this is typical of her asthma exacerbations and she is noted to have multiple ED visits since July for her asthma. She says she does not have a rescue inhaler at home because it has been difficult for her to afford it. She does not have a regular PCP she follows with. She did not have a flu shot this year.  Past Medical History  Diagnosis Date  . Anxiety   . Asthma   . Depression   . ADD (attention deficit disorder)    Past Surgical History  Procedure Laterality Date  . Wisdom tooth extraction    . Orif forearm fracture Right   . Fracture surgery  2004    rt arm has plate   Family History  Problem Relation Age of Onset  . Alcohol abuse Mother   . Arthritis Mother   . Depression Mother   . Heart disease Maternal Grandfather    Social History  Substance Use Topics  . Smoking status: Former Smoker -- 0.00 packs/day    Quit date: 12/02/2015  . Smokeless tobacco: Never Used  . Alcohol Use: 1.1 oz/week    1 Cans of beer, 1 Standard drinks or equivalent per week   OB History    No data available     Review of Systems  Constitutional: Positive for diaphoresis. Negative for fever.  HENT: Negative for rhinorrhea.   Respiratory: Positive for cough, chest tightness, shortness of breath and wheezing.   Cardiovascular: Negative for chest pain.  Musculoskeletal: Negative  for myalgias and arthralgias.      Allergies  Review of patient's allergies indicates no known allergies.  Home Medications   Prior to Admission medications   Medication Sig Start Date End Date Taking? Authorizing Provider  cetirizine (ZYRTEC) 10 MG tablet Take 10 mg by mouth daily.   Yes Historical Provider, MD  DULoxetine (CYMBALTA) 60 MG capsule Take 60 mg by mouth at bedtime.    Yes Historical Provider, MD  albuterol (PROVENTIL HFA;VENTOLIN HFA) 108 (90 Base) MCG/ACT inhaler Inhale 1-2 puffs into the lungs every 6 (six) hours as needed for wheezing or shortness of breath. 12/02/15   Darreld McleanVishal Burdett Pinzon, MD  doxycycline (VIBRA-TABS) 100 MG tablet Take 1 tablet (100 mg total) by mouth 2 (two) times daily. 12/02/15 12/06/15  Darreld McleanVishal Abimael Zeiter, MD   BP 112/65 mmHg  Pulse 77  Temp(Src) 98.2 F (36.8 C) (Oral)  Resp 12  SpO2 100%  LMP 11/11/2015 Physical Exam  Constitutional: She is oriented to person, place, and time. She appears well-developed and well-nourished. No distress.  Patient sitting up in bed, no acute distress  Cardiovascular: Normal rate and regular rhythm.   Pulmonary/Chest: Effort normal. No respiratory distress.  Tight expiratory wheezing throughout lung fields  Neurological: She is alert and oriented to person, place, and time.  Skin: She is not diaphoretic.  Vitals reviewed.   ED Course  Procedures (including  critical care time) Labs Review Labs Reviewed - No data to display  Imaging Review Dg Chest 2 View  12/02/2015  CLINICAL DATA:  Acute shortness of breath. EXAM: CHEST  2 VIEW COMPARISON:  July 08, 2015. FINDINGS: The heart size and mediastinal contours are within normal limits. Both lungs are clear. No pneumothorax or pleural effusion is noted. The visualized skeletal structures are unremarkable. IMPRESSION: No active cardiopulmonary disease. Electronically Signed   By: Lupita Raider, M.D.   On: 12/02/2015 07:58   I have personally reviewed and evaluated these  images and lab results as part of my medical decision-making.   EKG Interpretation   Date/Time:  Saturday December 02 2015 06:41:46 EDT Ventricular Rate:  86 PR Interval:  136 QRS Duration: 75 QT Interval:  399 QTC Calculation: 477 R Axis:   61 Text Interpretation:  Sinus rhythm Low voltage, precordial leads  Borderline prolonged QT interval No significant change since last tracing  Confirmed by Bebe Shaggy  MD, DONALD ([id number]) on 12/02/2015 6:59:02 AM      MDM   Final diagnoses:  Asthma, mild intermittent, with acute exacerbation    25 year old female with PMH of asthma who presents with SOB, wheezing, and cough with occasional green sputum production. She reports symptomatic improvement with albuterol nebulizer. Symptoms consistent with asthma exacerbation with probable bronchitis. She denies any myalgias or fevers to suggest influenza.  Will get CXR, repeat Albuterol nebulizer, and give IM Decadron 10 mg.   CXR without infiltration, consolidation, or evidence of acute cardiopulmonary process.  Will prescribe short course of doxycycline for bronchitis and albuterol inhaler.  Patient preferred to leave ED rather than wait for social work for medication needs and primary care establishment. She is provided resources for finding primary care providers and given information for [facility].   Darreld Mclean, MD 12/02/15 0930  Nelva Nay, MD 12/28/15 [phone]

## 2015-12-02 NOTE — Discharge Instructions (Signed)
Please contact [facility] to establish care and for medication assistance.    Asthma Attack Prevention While you may not be able to control the fact that you have asthma, you can take actions to prevent asthma attacks. The best way to prevent asthma attacks is to maintain good control of your asthma. You can achieve this by:  Taking your medicines as directed.  Avoiding things that can irritate your airways or make your asthma symptoms worse (asthma triggers).  Keeping track of how well your asthma is controlled and of any changes in your symptoms.  Responding quickly to worsening asthma symptoms (asthma attack).  Seeking emergency care when it is needed. WHAT ARE SOME WAYS TO PREVENT AN ASTHMA ATTACK? Have a Plan Work with your health care provider to create a written plan for managing and treating your asthma attacks (asthma action plan). This plan includes:  A list of your asthma triggers and how you can avoid them.  Information on when medicines should be taken and when their dosages should be changed.  The use of a device that measures how well your lungs are working (peak flow meter). Monitor Your Asthma Use your peak flow meter and record your results in a journal every day. A drop in your peak flow numbers on one or more days may indicate the start of an asthma attack. This can happen even before you start to feel symptoms. You can prevent an asthma attack from getting worse by following the steps in your asthma action plan. Avoid Asthma Triggers Work with your asthma health care provider to find out what your asthma triggers are. This can be done by:  Allergy testing.  Keeping a journal that notes when asthma attacks occur and the factors that may have contributed to them.  Determining if there are other medical conditions that are making your asthma worse. Once you have determined your asthma triggers, take steps to avoid them. This may include avoiding  excessive or prolonged exposure to:  Dust. Have someone dust and vacuum your home for you once or twice a week. Using a high-efficiency particulate arrestance (HEPA) vacuum is best.  Smoke. This includes campfire smoke, forest fire smoke, and secondhand smoke from tobacco products.  Pet dander. Avoid contact with animals that you know you are allergic to.  Allergens from trees, grasses or pollens. Avoid spending a lot of time outdoors when pollen counts are high, and on very windy days.  Very cold, dry, or humid air.  Mold.  Foods that contain high amounts of sulfites.  Strong odors.  Outdoor air pollutants, such as Museum/gallery exhibitions officer.  Indoor air pollutants, such as aerosol sprays and fumes from household cleaners.  Household pests, including dust mites and cockroaches, and pest droppings.  Certain medicines, including NSAIDs. Always talk to your health care provider before stopping or starting any new medicines. Medicines Take over-the-counter and prescription medicines only as told by your health care provider. Many asthma attacks can be prevented by carefully following your medicine schedule. Taking your medicines correctly is especially important when you cannot avoid certain asthma triggers. Act Quickly If an asthma attack does happen, acting quickly can decrease how severe it is and how long it lasts. Take these steps:   Pay attention to your symptoms. If you are coughing, wheezing, or having difficulty breathing, do not wait to see if your symptoms go away on their own. Follow your asthma action plan.  If you have followed your asthma action plan  and your symptoms are not improving, call your health care provider or seek immediate medical care at the nearest hospital. It is important to note how often you need to use your fast-acting rescue inhaler. If you are using your rescue inhaler more often, it may mean that your asthma is not under control. Adjusting your asthma treatment  plan may help you to prevent future asthma attacks and help you to gain better control of your condition. HOW CAN I PREVENT AN ASTHMA ATTACK WHEN I EXERCISE? Follow advice from your health care provider about whether you should use your fast-acting inhaler before exercising. Many people with asthma experience exercise-induced bronchoconstriction (EIB). This condition often worsens during vigorous exercise in cold, humid, or dry environments. Usually, people with EIB can stay very active by pre-treating with a fast-acting inhaler before exercising.   This information is not intended to replace advice given to you by your health care provider. Make sure you discuss any questions you have with your health care provider.   Document Released: 08/07/2009 Document Revised: 05/10/2015 Document Reviewed: 01/19/2015 Elsevier Interactive Patient Education [employer]2016 Elsevier Inc.

## 2016-11-26 IMAGING — CR DG CHEST 2V
2 series · 2 of 2 positions shown · non-contrast
Comparison: None

CLINICAL DATA: 23-year-old female with shortness of breath and
cough

EXAM:
CHEST  2 VIEW

[chest pa]
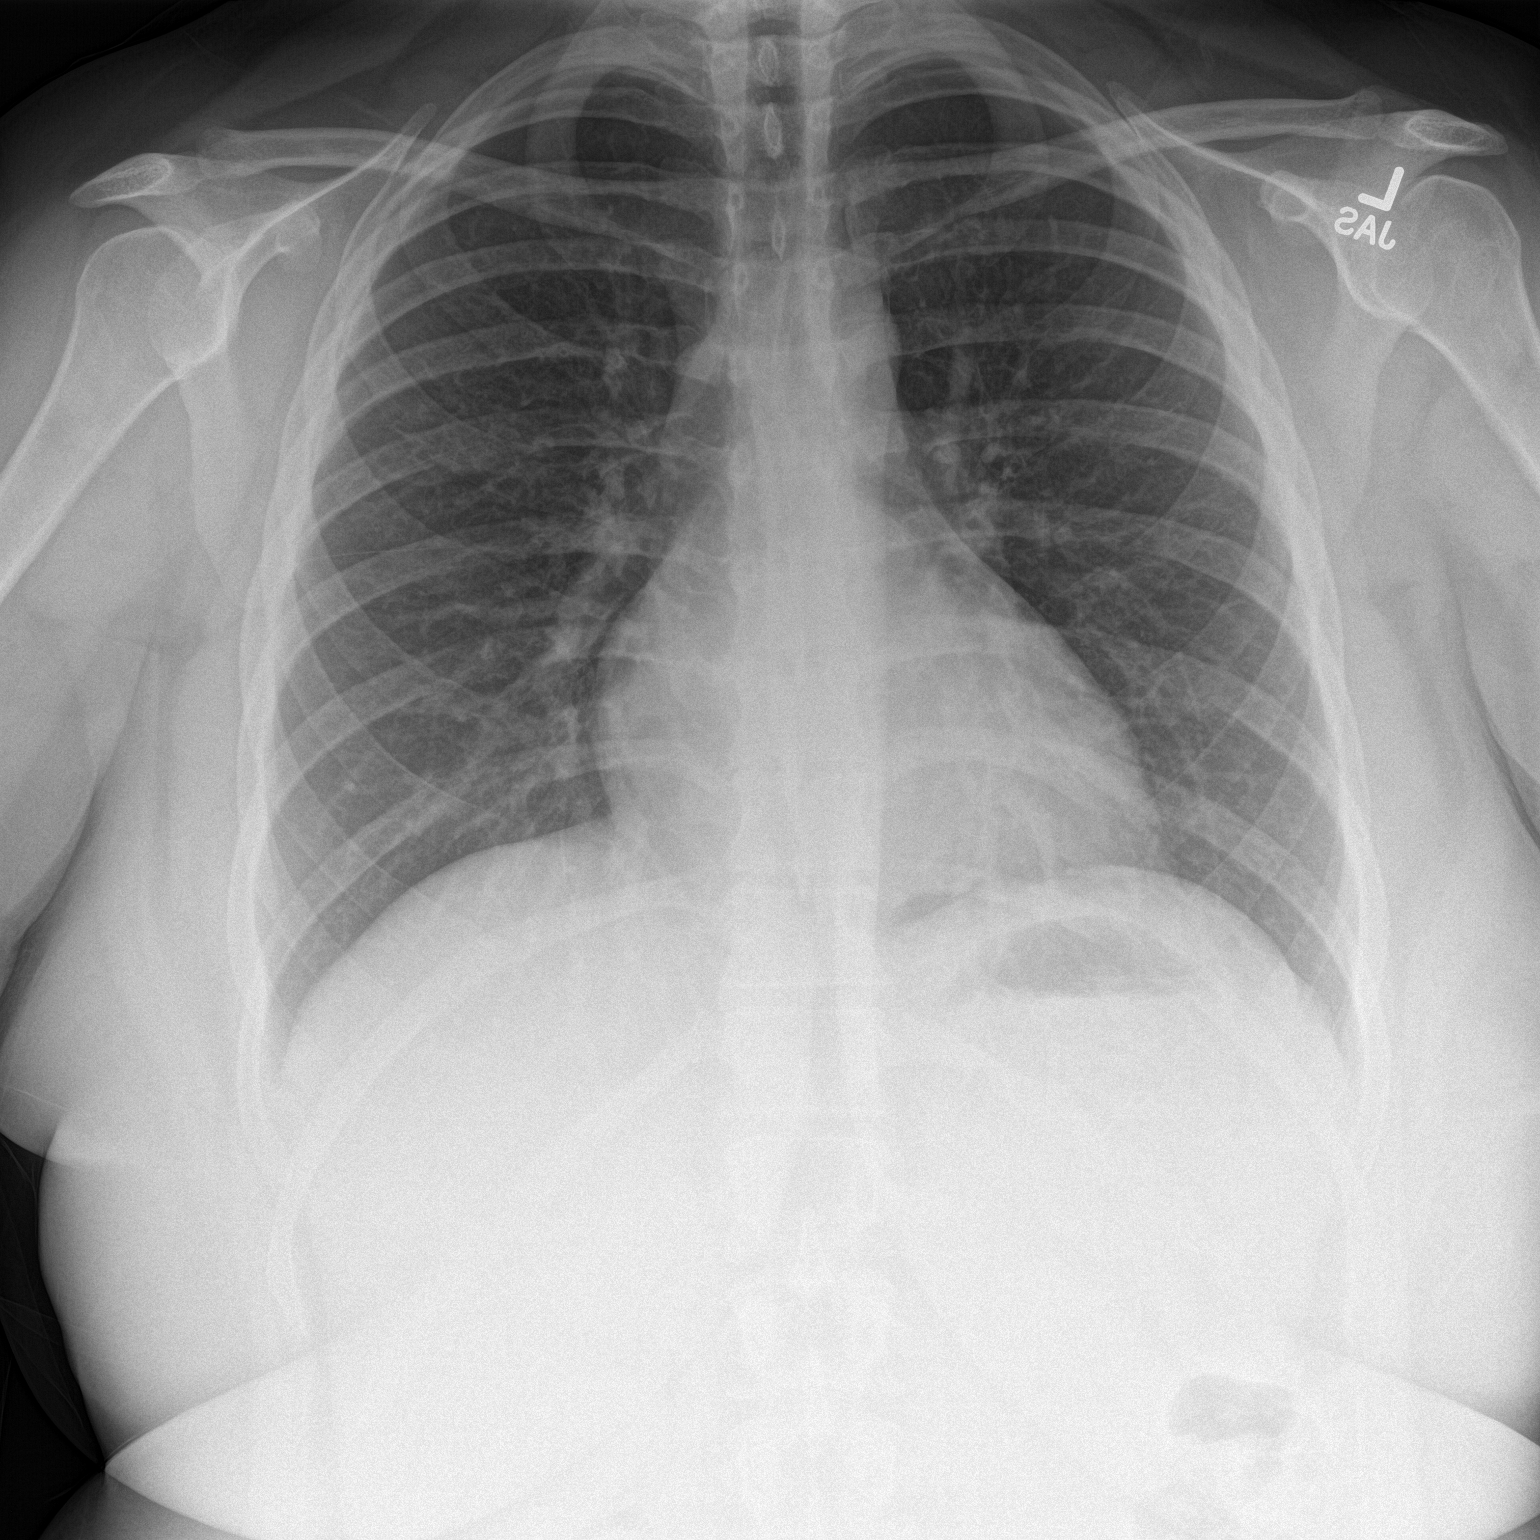

[chest lat]
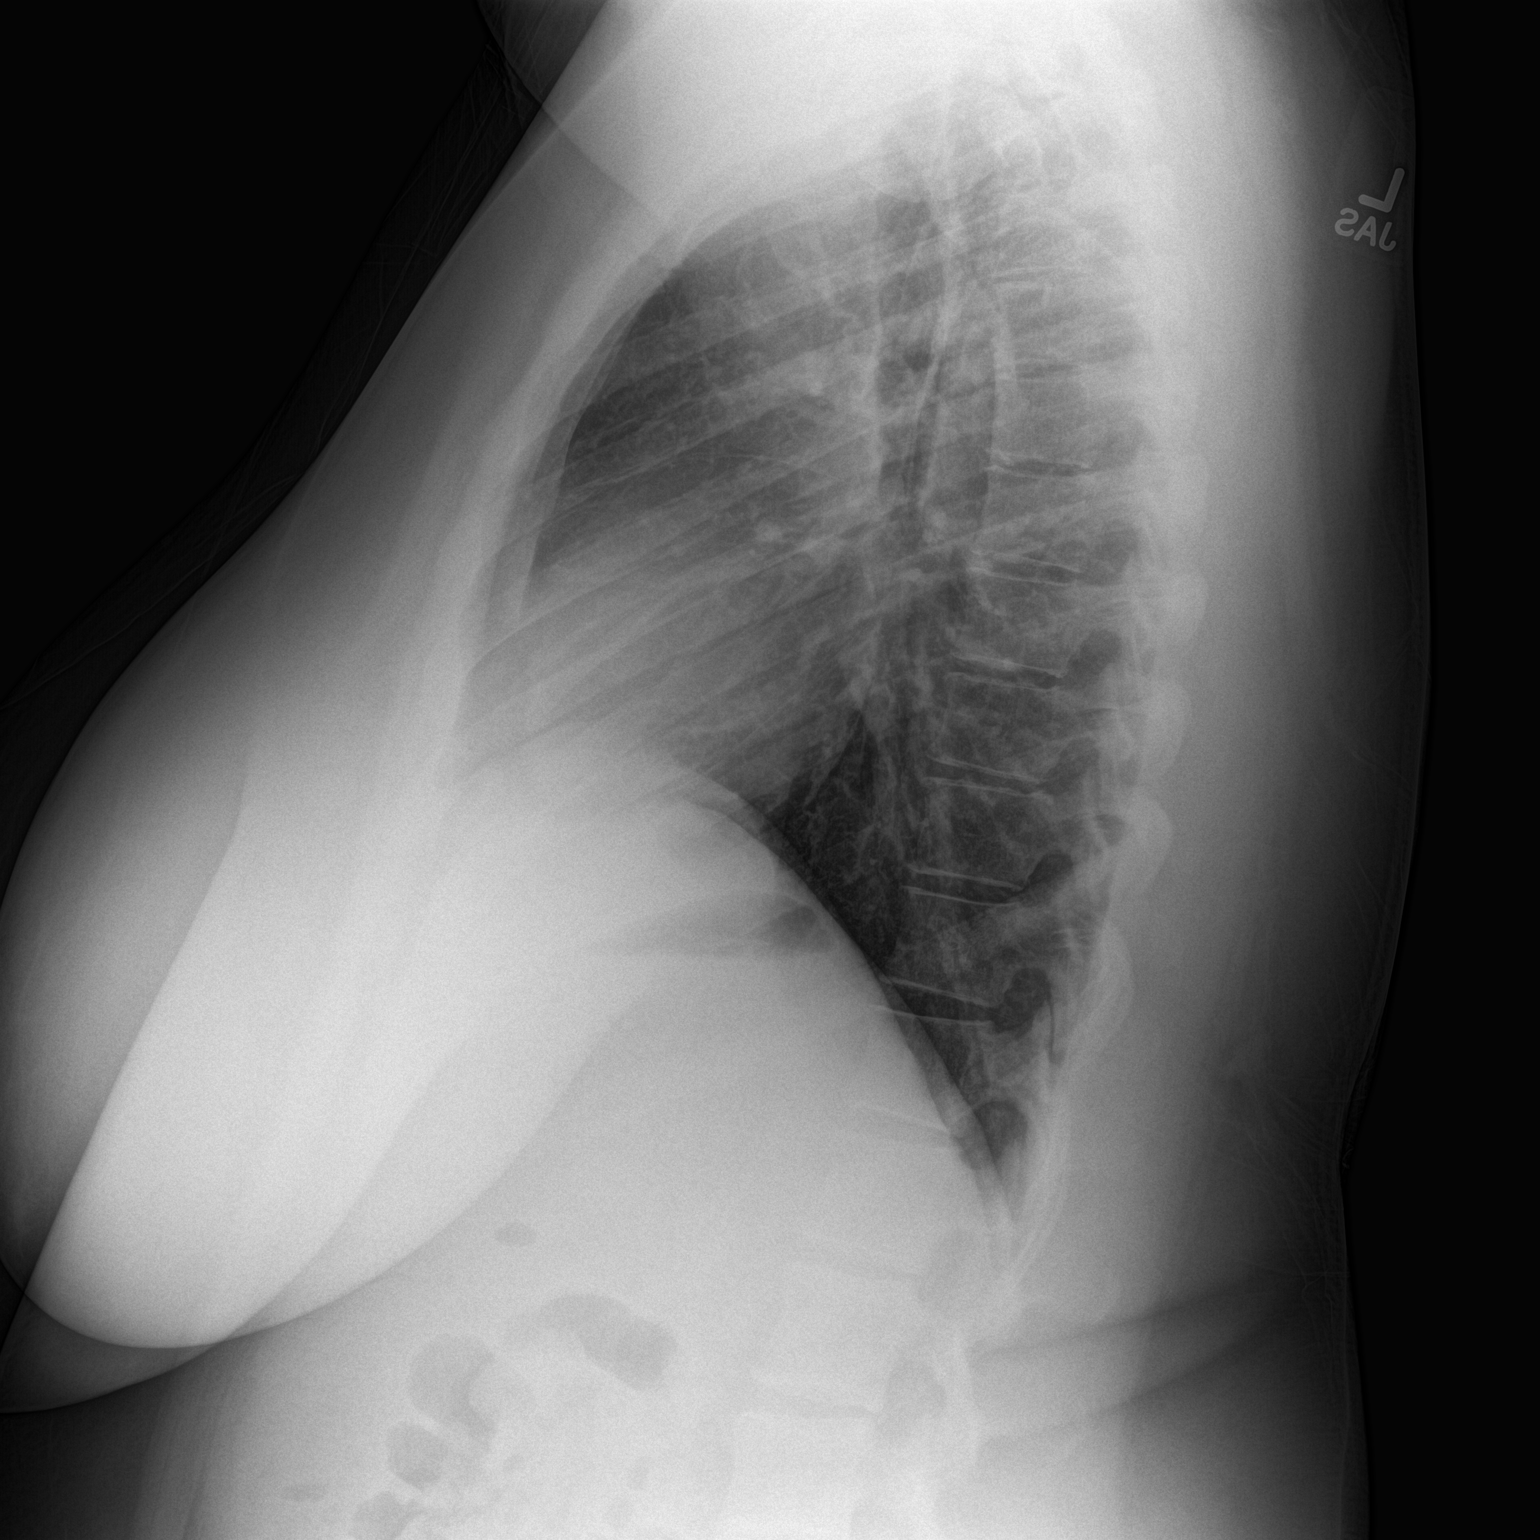

[2 of 2 positions shown; findings below may reference images not displayed]

FINDINGS: The heart size and mediastinal contours are within normal limits.
Both lungs are clear. The visualized skeletal structures are
unremarkable.
IMPRESSION: No active cardiopulmonary disease.

## 2017-03-16 ENCOUNTER — Encounter (HOSPITAL_COMMUNITY): Payer: Self-pay | Admitting: Emergency Medicine

## 2017-03-16 ENCOUNTER — Emergency Department (HOSPITAL_COMMUNITY)
Admission: EM | Admit: 2017-03-16 | Discharge: 2017-03-16 | Disposition: A | Discharge status: Home / Self Care | Payer: Self-pay | Attending: Emergency Medicine | Admitting: Emergency Medicine

## 2017-03-16 ENCOUNTER — Emergency Department (HOSPITAL_COMMUNITY): Payer: Self-pay

## 2017-03-16 DIAGNOSIS — S51812A Laceration without foreign body of left forearm, initial encounter: Secondary | ICD-10-CM | POA: Insufficient documentation

## 2017-03-16 DIAGNOSIS — Y9311 Activity, swimming: Secondary | ICD-10-CM | POA: Insufficient documentation

## 2017-03-16 DIAGNOSIS — Y999 Unspecified external cause status: Secondary | ICD-10-CM | POA: Insufficient documentation

## 2017-03-16 DIAGNOSIS — Z79899 Other long term (current) drug therapy: Secondary | ICD-10-CM | POA: Insufficient documentation

## 2017-03-16 DIAGNOSIS — Z87891 Personal history of nicotine dependence: Secondary | ICD-10-CM | POA: Insufficient documentation

## 2017-03-16 DIAGNOSIS — W01110A Fall on same level from slipping, tripping and stumbling with subsequent striking against sharp glass, initial encounter: Secondary | ICD-10-CM | POA: Insufficient documentation

## 2017-03-16 DIAGNOSIS — J45909 Unspecified asthma, uncomplicated: Secondary | ICD-10-CM | POA: Insufficient documentation

## 2017-03-16 DIAGNOSIS — Y92196 Pool of other specified residential institution as the place of occurrence of the external cause: Secondary | ICD-10-CM | POA: Insufficient documentation

## 2017-03-16 MED ORDER — SULFAMETHOXAZOLE-TRIMETHOPRIM 800-160 MG PO TABS
1.0000 | ORAL_TABLET | Freq: Once | ORAL | Status: AC
  Administered 2017-03-16: 1 via ORAL
  Filled 2017-03-16: qty 1

## 2017-03-16 MED ORDER — CEPHALEXIN 500 MG PO CAPS
500.0000 mg | ORAL_CAPSULE | Freq: Once | ORAL | Status: AC
  Administered 2017-03-16: 500 mg via ORAL
  Filled 2017-03-16: qty 1

## 2017-03-16 MED ORDER — CEPHALEXIN 500 MG PO CAPS: 500.0000 mg | ORAL_CAPSULE | Freq: Four times a day (QID) | ORAL | 0 refills | Status: DC

## 2017-03-16 MED ORDER — IBUPROFEN 800 MG PO TABS
800.0000 mg | ORAL_TABLET | Freq: Once | ORAL | Status: AC
  Administered 2017-03-16: 800 mg via ORAL
  Filled 2017-03-16: qty 1

## 2017-03-16 MED ORDER — ONDANSETRON HCL 4 MG PO TABS
4.0000 mg | ORAL_TABLET | Freq: Once | ORAL | Status: AC
  Administered 2017-03-16: 4 mg via ORAL
  Filled 2017-03-16: qty 1

## 2017-03-16 MED ORDER — SULFAMETHOXAZOLE-TRIMETHOPRIM 800-160 MG PO TABS: 1.0000 | ORAL_TABLET | Freq: Two times a day (BID) | ORAL | 0 refills | Status: AC

## 2017-03-16 MED ORDER — ACETAMINOPHEN 325 MG PO TABS
650.0000 mg | ORAL_TABLET | Freq: Once | ORAL | Status: AC
  Administered 2017-03-16: 650 mg via ORAL
  Filled 2017-03-16: qty 2
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
## 2017-03-16 NOTE — ED Notes (Signed)
HB in to assess 
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
## 2017-03-16 NOTE — Discharge Instructions (Signed)
Your vital signs within normal limits. The x-ray of your forearm is negative for fracture or foreign body or gas related to infection. Please leave the Steri-Strips in place. Do not apply any Neosporin, lotion, Vaseline, or petroleum products as this will breakdown the glue holding the Steri-Strips. Please see Miss Durwin NoraDixon or member of her team for follow-up in the office.
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
## 2017-04-10 ENCOUNTER — Ambulatory Visit (INDEPENDENT_AMBULATORY_CARE_PROVIDER_SITE_OTHER): Payer: Self-pay | Admitting: Physician Assistant

## 2017-04-10 ENCOUNTER — Encounter: Payer: Self-pay | Admitting: Physician Assistant

## 2017-04-10 VITALS — BP 108/72 | HR 93 | Temp 97.9°F | Resp 14 | Wt 242.4 lb

## 2017-04-10 DIAGNOSIS — N912 Amenorrhea, unspecified: Secondary | ICD-10-CM

## 2017-04-10 DIAGNOSIS — Z30011 Encounter for initial prescription of contraceptive pills: Secondary | ICD-10-CM

## 2017-04-10 DIAGNOSIS — Z113 Encounter for screening for infections with a predominantly sexual mode of transmission: Secondary | ICD-10-CM

## 2017-04-10 LAB — WET PREP FOR TRICH, YEAST, CLUE
Trich, Wet Prep: NONE SEEN
Yeast Wet Prep HPF POC: NONE SEEN

## 2017-04-10 LAB — PREGNANCY, URINE: Preg Test, Ur: NEGATIVE

## 2017-04-10 MED ORDER — DESOGESTREL-ETHINYL ESTRADIOL 0.15-0.02/0.01 MG (21/5) PO TABS
1.0000 | ORAL_TABLET | Freq: Every day | ORAL | 11 refills | Status: DC
Start: 2017-04-10 — End: 2022-12-31

## 2017-04-10 NOTE — Progress Notes (Signed)
Patient ID: Judith Hansen MRN: [id number], DOB: Apr 21, 1991, 26 y.o. Date of Encounter: @DATE @  Chief Complaint:  Chief Complaint  Patient presents with  . no menstrual cycle  . std check    HPI: 26 y.o. year old female  presents with her mom for OV today.   She reports that her last menstrual cycle started on July 2. She has done some home pregnancy tests that have been coming back negative "but needed to hear it from a doctor's mouth to believe it and make sure". Therefore wants to do pregnancy test. Says that she knows that her period might be starting late just secondary to stress. Says that this has been a "rough month ". Had a fall and an injury and had to take antibiotics etc.  Also says that she wants to do full STD screen. Is having no symptoms of infection but wants screen.  Also wants to get back on some birth control. Says that in the past GYN had prescribed birth control pill. Was on Yaz and then changed to [facility]. Says that she just wants to get on some type of birth control pill that she can take every morning and then not have to worry about being pregnant.  No other concerns to address today.   Past Medical History:  Diagnosis Date  . ADD (attention deficit disorder)   . Anxiety   . Asthma   . Depression      Home Meds: Outpatient Medications Prior to Visit  Medication Sig Dispense Refill  . albuterol (PROVENTIL HFA;VENTOLIN HFA) 108 (90 Base) MCG/ACT inhaler Inhale 1-2 puffs into the lungs every 6 (six) hours as needed for wheezing or shortness of breath. 1 Inhaler 0  . cetirizine (ZYRTEC) 10 MG tablet Take 10 mg by mouth daily.    . DULoxetine (CYMBALTA) 60 MG capsule Take 60 mg by mouth at bedtime.     . cephALEXin (KEFLEX) 500 MG capsule Take 1 capsule (500 mg total) by mouth 4 (four) times daily. 20 capsule 0   No facility-administered medications prior to visit.     Allergies: No Known Allergies  Social History   Social History  . Marital  status: Single    Spouse name: N/A  . Number of children: N/A  . Years of education: N/A   Occupational History  . Not on file.   Social History Main Topics  . Smoking status: Former Smoker    Packs/day: 0.00    Quit date: 12/02/2015  . Smokeless tobacco: Never Used  . Alcohol use Yes     Comment: occasional  . Drug use: No  . Sexual activity: Yes    Birth control/ protection: None   Other Topics Concern  . Not on file   Social History Narrative   Entered 03/2014:   Works 2nd shift at [employer] with her mom    Family History  Problem Relation Age of Onset  . Alcohol abuse Mother   . Arthritis Mother   . Depression Mother   . Heart disease Maternal Grandfather      Review of Systems:  See HPI for pertinent ROS. All other ROS negative.    Physical Exam: Blood pressure 108/72, pulse 93, temperature 97.9 F (36.6 C), temperature source Oral, resp. rate 14, weight 242 lb 6.4 oz (110 kg), last menstrual period 03/03/2017, SpO2 98 %., Body mass index is 39.12 kg/m. General: Obese WF. Appears in no acute distress. Neck: Supple. No thyromegaly. No  lymphadenopathy. Lungs: Clear bilaterally to auscultation without wheezes, rales, or rhonchi. Breathing is unlabored. Heart: RRR with S1 S2. No murmurs, rubs, or gallops. Pelvic exam: External genitalia normal. Vaginal mucosa normal. Cervix normal. No cervical motion tenderness. Normal amount of vaginal discharge present. Musculoskeletal:  Strength and tone normal for age. Extremities/Skin: Warm and dry. Neuro: Alert and oriented X 3. Moves all extremities spontaneously. Gait is normal. CNII-XII grossly in tact. Psych:  Responds to questions appropriately with a normal affect.     ASSESSMENT AND PLAN:  26 y.o. year old female with  1. Amenorrhea Pregnancy test here is negative. - Pregnancy, urine  2. Encounter for initial prescription of contraceptive pills She knows to take the pill every single morning at about  the same time of day on a consistent basis and no skipped pills. - desogestrel-ethinyl estradiol (MIRCETTE) 0.15-0.02/0.01 MG (21/5) tablet; Take 1 tablet by mouth daily.  Dispense: 1 Package; Refill: 11  3. Screening for STDs (sexually transmitted diseases) Will check full panel of screening labs for STDs. Also discussed methods to prevent exposure to STDs. - GC/Chlamydia Probe Amp - Hepatitis panel, acute - HIV antibody - HSV(herpes smplx)abs-1+2(IgG+IgM)-bld - RPR - WET PREP FOR TRICH, YEAST, CLUE   Signed, [address]Mary Beth SledgeDixon, GeorgiaPA, [facility]BSFM 04/10/2017 9:28 AM

## 2017-04-11 LAB — HIV ANTIBODY (ROUTINE TESTING W REFLEX): HIV 1&2 Ab, 4th Generation: NONREACTIVE

## 2017-04-11 LAB — GC/CHLAMYDIA PROBE AMP
CT Probe RNA: NOT DETECTED
GC Probe RNA: NOT DETECTED

## 2017-04-11 LAB — HEPATITIS PANEL, ACUTE
HCV Ab: NONREACTIVE
Hep A IgM: NONREACTIVE
Hep B C IgM: NONREACTIVE
Hepatitis B Surface Ag: NONREACTIVE

## 2017-04-11 LAB — RPR

## 2017-04-15 LAB — HSV TYPE I/II IGG, IGMW/ REFLEX
HSV 1 Glycoprotein G Ab, IgG: 25.2 Index — ABNORMAL HIGH (ref <0.90)
HSV 1 IgM Screen: NEGATIVE
HSV 2 Glycoprotein G Ab, IgG: <0.9 Index (ref <0.90)
HSV 2 IgM Screen: NEGATIVE

## 2017-07-03 ENCOUNTER — Encounter: Payer: Self-pay | Admitting: Physician Assistant

## 2017-09-25 ENCOUNTER — Encounter: Payer: Self-pay | Admitting: Physician Assistant

## 2017-11-05 ENCOUNTER — Encounter: Payer: Self-pay | Admitting: Physician Assistant

## 2018-04-13 ENCOUNTER — Ambulatory Visit: Payer: Self-pay | Admitting: Physician Assistant

## 2020-02-07 ENCOUNTER — Other Ambulatory Visit: Payer: Self-pay

## 2020-02-07 ENCOUNTER — Ambulatory Visit (HOSPITAL_COMMUNITY): Payer: Self-pay | Admitting: Clinical

## 2020-02-07 ENCOUNTER — Telehealth (HOSPITAL_COMMUNITY): Payer: Self-pay | Admitting: Psychiatry

## 2020-03-14 ENCOUNTER — Telehealth (INDEPENDENT_AMBULATORY_CARE_PROVIDER_SITE_OTHER): Payer: No Payment, Other | Admitting: Psychiatry

## 2020-03-14 ENCOUNTER — Ambulatory Visit (INDEPENDENT_AMBULATORY_CARE_PROVIDER_SITE_OTHER): Payer: No Payment, Other | Admitting: Clinical

## 2020-03-14 ENCOUNTER — Encounter (HOSPITAL_COMMUNITY): Payer: Self-pay | Admitting: Psychiatry

## 2020-03-14 ENCOUNTER — Other Ambulatory Visit: Payer: Self-pay

## 2020-03-14 DIAGNOSIS — F419 Anxiety disorder, unspecified: Secondary | ICD-10-CM | POA: Diagnosis not present

## 2020-03-14 DIAGNOSIS — F331 Major depressive disorder, recurrent, moderate: Secondary | ICD-10-CM | POA: Insufficient documentation

## 2020-03-14 DIAGNOSIS — F3342 Major depressive disorder, recurrent, in full remission: Secondary | ICD-10-CM | POA: Diagnosis not present

## 2020-03-14 DIAGNOSIS — F339 Major depressive disorder, recurrent, unspecified: Secondary | ICD-10-CM | POA: Insufficient documentation

## 2020-03-14 MED ORDER — DULOXETINE HCL 60 MG PO CPEP: 60.0000 mg | ORAL_CAPSULE | Freq: Every day | ORAL | 2 refills | Status: DC

## 2020-03-14 NOTE — Progress Notes (Signed)
   THERAPIST PROGRESS NOTE Virtual Visit via Video Note  I connected with [name] on 03/14/20 at  2:00 PM EDT by a video enabled telemedicine application and verified that I am speaking with the correct person using two identifiers.  Location: Patient: Community Provider: Office   I discussed the limitations of evaluation and management by telemedicine and the availability of in person appointments. The patient expressed understanding and agreed to proceed.  Session Time: 25 minutes  Participation Level: Active  Behavioral Response: CasualAlertEuthymic  Type of Therapy: Individual Therapy  Treatment Goals addressed: Coping  Interventions: Supportive  Summary:  Judith Hansen is a 29 y.o. female who presents with symptoms of depression. Client was referred for behavioral health services from [facility] [place]. Client presented to the scheduled session via video. Client presented oriented times five, appropriately dressed, and friendly. Client presented for the scheduled appointment indicated for the purpose of completing an initial intake assessment. Client reported she was unable to complete the remainder of her assessment due to having to go back to work.  Suicidal/Homicidal: Nowithout intent/plan  Therapist Response:  Therapist conducted the session in regards to conducting a clinical assessment. Therapist was able to complete the biopsychosocial portion of the assessment but unable to complete the remainder for childhood, employment, and substance use history due to the clients report of having to go back to work. Therapist was unable to complete SDOH and discuss treatment planning.   Plan: return phone call to the office to reschedule another appointment to complete the assessment.  Diagnosis: Recurrent major depressive disorder, in full remission   Follow Up Instructions:    I discussed the assessment and treatment plan with the patient. The patient was provided an  opportunity to ask questions and all were answered. The patient agreed with the plan and demonstrated an understanding of the instructions.   The patient was advised to call back or seek an in-person evaluation if the symptoms worsen or if the condition fails to improve as anticipated.   Neena Rhymes Rion Catala, LCSW    [employer], LCSW 03/14/2020

## 2020-03-14 NOTE — Progress Notes (Signed)
Psychiatric Initial Adult Assessment   Virtual Visit via Video Note  I connected with Judith Hansen on 03/14/20 at  1:00 PM EDT by a video enabled telemedicine application and verified that I am speaking with the correct person using two identifiers.  Location: Patient: Home Provider: Clinic   I discussed the limitations of evaluation and management by telemedicine and the availability of in person appointments. The patient expressed understanding and agreed to proceed.  I provided 35 minutes of non-face-to-face time during this encounter.    Patient Identification: Judith Hansen MRN:  [id number] Date of Evaluation:  03/14/2020   Referral Source: Vesta Mixer  Chief Complaint:   " I am doing okay."  Visit Diagnosis:    ICD-10-CM   1. Recurrent major depressive disorder, in full remission (HCC)  F33.42   2. Anxiety  F41.9     History of Present Illness: This is a 29 year old female with history of MDD and anxiety now seen for evaluation.  She was being managed at [facility].  She was being prescribed Cymbalta 60 mg daily.  Patient informed that she has done fairly well after being started on Cymbalta.  She informed that she has taken several different medications in the past but nothing helped much.  She stated that with help of Cymbalta she feels like an "even kill". She stated that she is able to focus at work and her personal life is going well.  She is able to sleep well at night. She denied any ongoing symptoms of depression like anhedonia, low energy levels, poor concentration, poor sleep. She reported that sometimes she has episodes that last for a few minutes when she feels very anxious and she has a tingling sensation down her spine.  She stated that she used to have these episodes few times a week in the past however now she may have one episode in 2 weeks.  She stated that the episodes are not as intense as they used to be.  She asked if she needs to be concerned about  them.  Writer informed her based on what she is describing it seems like she has intermittent anxiety or panic attacks.  Since the episodes are not as intense and less frequent as they used to be after starting Cymbalta we can continue the same regimen for now.  She informed that she has some leftover as needed hydroxyzine from the past.  Writer advised that she may use that as needed for the panic attacks.    Past Psychiatric History: MDD, anxiety  Previous Psychotropic Medications: Yes  - Lexapro- had side effects, Lamictal- ineffective.  Substance Abuse History in the last 12 months:  No.  Consequences of Substance Abuse: NA  Past Medical History:  Past Medical History:  Diagnosis Date  . ADD (attention deficit disorder)   . Anxiety   . Asthma   . Depression     Past Surgical History:  Procedure Laterality Date  . FRACTURE SURGERY  2004   rt arm has plate  . ORIF FOREARM FRACTURE Right   . WISDOM TOOTH EXTRACTION      Family Psychiatric History: see below  Family History:  Family History  Problem Relation Age of Onset  . Alcohol abuse Mother   . Arthritis Mother   . Depression Mother   . Heart disease Maternal Grandfather     Social History:   Social History   Socioeconomic History  . Marital status: Single    Spouse name: Not on file  .  Number of children: Not on file  . Years of education: Not on file  . Highest education level: Not on file  Occupational History  . Not on file  Tobacco Use  . Smoking status: Former Smoker    Packs/day: 0.00    Quit date: 12/02/2015    Years since quitting: 4.2  . Smokeless tobacco: Never Used  Vaping Use  . Vaping Use: Never used  Substance and Sexual Activity  . Alcohol use: Yes    Comment: occasional  . Drug use: No  . Sexual activity: Yes    Birth control/protection: None  Other Topics Concern  . Not on file  Social History Narrative   Entered 03/2014:   Works 2nd shift at [employer] with her mom    Social Determinants of Corporate investment banker Strain:   . Difficulty of Paying Living Expenses:   Food Insecurity:   . Worried About Programme researcher, broadcasting/film/video in the Last Year:   . Barista in the Last Year:   Transportation Needs:   . Freight forwarder (Medical):   Marland Kitchen Lack of Transportation (Non-Medical):   Physical Activity:   . Days of Exercise per Week:   . Minutes of Exercise per Session:   Stress:   . Feeling of Stress :   Social Connections:   . Frequency of Communication with Friends and Family:   . Frequency of Social Gatherings with Friends and Family:   . Attends Religious Services:   . Active Member of Clubs or Organizations:   . Attends Banker Meetings:   Marland Kitchen Marital Status:     Additional Social History: Lives with her fianc.  She has a good relationship with him.  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  Allergies:  No Known Allergies  Metabolic Disorder Labs: No results found for: HGBA1C, MPG No results found for: PROLACTIN No results found for: CHOL, TRIG, HDL, CHOLHDL, VLDL, LDLCALC No results found for: TSH  Therapeutic Level Labs: No results found for: LITHIUM No results found for: CBMZ No results found for: VALPROATE  Current Medications: Current Outpatient Medications  Medication Sig Dispense Refill  . albuterol (PROVENTIL HFA;VENTOLIN HFA) 108 (90 Base) MCG/ACT inhaler Inhale 1-2 puffs into the lungs every 6 (six) hours as needed for wheezing or shortness of breath. 1 Inhaler 0  . cetirizine (ZYRTEC) 10 MG tablet Take 10 mg by mouth daily.    Marland Kitchen desogestrel-ethinyl estradiol (MIRCETTE) 0.15-0.02/0.01 MG (21/5) tablet Take 1 tablet by mouth daily. 1 Package 11  . DULoxetine (CYMBALTA) 60 MG capsule Take 60 mg by mouth at bedtime.      No current facility-administered medications for this visit.    Psychiatric Specialty Exam: Review of Systems  There were no vitals taken for this  visit.There is no height or weight on file to calculate BMI.  General Appearance: Fairly Groomed  Eye Contact:  Good  Speech:  Clear and Coherent and Normal Rate  Volume:  Normal  Mood:  Euthymic  Affect:  Congruent  Thought Process:  Goal Directed and Descriptions of Associations: Intact  Orientation:  Full (Time, Place, and Person)  Thought Content:  Logical  Suicidal Thoughts:  No  Homicidal Thoughts:  No  Memory:  Immediate;   Good Recent;   Good  Judgement:  Fair  Insight:  Fair  Psychomotor Activity:  Normal  Concentration:  Concentration: Good and  Attention Span: Good  Recall:  Good  Fund of Knowledge:Good  Language: Good  Akathisia:  Negative  Handed:  Right  AIMS (if indicated):  not done  Assets:  Communication Skills Desire for Improvement Financial Resources/Insurance Housing  ADL's:  Intact  Cognition: WNL  Sleep:  Good     Assessment and Plan: Based on patient's history and presentation Cymbalta appears to have helped her symptoms of depression anxiety.  She is still complaining of intermittent anxiety or panic attacks.  She has hydroxyzine from the past, she was advised to use it as needed for anxiety.  We will continue Cymbalta 60 mg daily for now.  1. Recurrent major depressive disorder, in full remission (HCC)  - DULoxetine (CYMBALTA) 60 MG capsule; Take 1 capsule (60 mg total) by mouth at bedtime.  Dispense: 30 capsule; Refill: 2  2. Anxiety  - DULoxetine (CYMBALTA) 60 MG capsule; Take 1 capsule (60 mg total) by mouth at bedtime.  Dispense: 30 capsule; Refill: 2   Continue same medication regimen. Follow up in 2 months.  Zena Amos, MD 7/13/20211:38 PM

## 2020-05-16 ENCOUNTER — Other Ambulatory Visit: Payer: Self-pay

## 2020-05-16 ENCOUNTER — Telehealth (INDEPENDENT_AMBULATORY_CARE_PROVIDER_SITE_OTHER): Payer: No Payment, Other | Admitting: Psychiatry

## 2020-05-16 ENCOUNTER — Encounter (HOSPITAL_COMMUNITY): Payer: Self-pay | Admitting: Psychiatry

## 2020-05-16 DIAGNOSIS — F419 Anxiety disorder, unspecified: Secondary | ICD-10-CM

## 2020-05-16 DIAGNOSIS — F3342 Major depressive disorder, recurrent, in full remission: Secondary | ICD-10-CM

## 2020-05-16 MED ORDER — HYDROXYZINE HCL 25 MG PO TABS: 25.0000 mg | ORAL_TABLET | Freq: Two times a day (BID) | ORAL | 1 refills | Status: DC | PRN

## 2020-05-16 MED ORDER — DULOXETINE HCL 60 MG PO CPEP: 60.0000 mg | ORAL_CAPSULE | Freq: Every day | ORAL | 2 refills | Status: DC

## 2020-05-16 NOTE — Progress Notes (Signed)
BH MD/PA/NP OP Progress Note  Virtual Visit via Video Note  I connected with Judith Hansen on 05/16/20 at  8:40 AM EDT by a video enabled telemedicine application and verified that I am speaking with the correct person using two identifiers.  Location: Patient: Home Provider: Clinic   I discussed the limitations of evaluation and management by telemedicine and the availability of in person appointments. The patient expressed understanding and agreed to proceed.  I provided 16 minutes of non-face-to-face time during this encounter.     05/16/2020 8:46 AM Judith Hansen  MRN:  [id number]  Chief Complaint:  " I am doing okay."   HPI: Patient reported that she is doing well.  She stated that she noticed that her anxiety improved significantly after she stopped taking the birth control pill.  She stated that she could tell a big difference.  However unfortunately she left to restart the birth control pill as she and her partner are not ready to start a family yet.  She was advised to discuss this with her OB/GYN and choose a better option accordingly. She informed that she has not used hydroxyzine in a while because she avoids taking too many pills.  However she is open to having as needed dose for breakthrough anxiety attacks. She denied any other concerns at this time.   Visit Diagnosis:    ICD-10-CM   1. Recurrent major depressive disorder, in full remission (HCC)  F33.42 DULoxetine (CYMBALTA) 60 MG capsule  2. Anxiety  F41.9 DULoxetine (CYMBALTA) 60 MG capsule    hydrOXYzine (ATARAX/VISTARIL) 25 MG tablet    Past Psychiatric History: depression, anxiety  Past Medical History:  Past Medical History:  Diagnosis Date  . ADD (attention deficit disorder)   . Anxiety   . Asthma   . Depression     Past Surgical History:  Procedure Laterality Date  . FRACTURE SURGERY  2004   rt arm has plate  . ORIF FOREARM FRACTURE Right   . WISDOM TOOTH EXTRACTION      Family Psychiatric  History: see below  Family History:  Family History  Problem Relation Age of Onset  . Alcohol abuse Mother   . Arthritis Mother   . Depression Mother   . Heart disease Maternal Grandfather     Social History:  Social History   Socioeconomic History  . Marital status: Single    Spouse name: Not on file  . Number of children: Not on file  . Years of education: Not on file  . Highest education level: Not on file  Occupational History  . Not on file  Tobacco Use  . Smoking status: Former Smoker    Packs/day: 0.00    Quit date: 12/02/2015    Years since quitting: 4.4  . Smokeless tobacco: Never Used  Vaping Use  . Vaping Use: Never used  Substance and Sexual Activity  . Alcohol use: Yes    Comment: occasional  . Drug use: No  . Sexual activity: Yes    Birth control/protection: None  Other Topics Concern  . Not on file  Social History Narrative   Entered 03/2014:   Works 2nd shift at [employer] with her mom   Social Determinants of Corporate investment banker Strain:   . Difficulty of Paying Living Expenses: Not on file  Food Insecurity:   . Worried About Programme researcher, broadcasting/film/video in the Last Year: Not on file  . Ran Out of Food in the  Last Year: Not on file  Transportation Needs:   . Lack of Transportation (Medical): Not on file  . Lack of Transportation (Non-Medical): Not on file  Physical Activity:   . Days of Exercise per Week: Not on file  . Minutes of Exercise per Session: Not on file  Stress:   . Feeling of Stress : Not on file  Social Connections:   . Frequency of Communication with Friends and Family: Not on file  . Frequency of Social Gatherings with Friends and Family: Not on file  . Attends Religious Services: Not on file  . Active Member of Clubs or Organizations: Not on file  . Attends Banker Meetings: Not on file  . Marital Status: Not on file    Allergies: No Known Allergies  Metabolic Disorder Labs: No results found for:  HGBA1C, MPG No results found for: PROLACTIN No results found for: CHOL, TRIG, HDL, CHOLHDL, VLDL, LDLCALC No results found for: TSH  Therapeutic Level Labs: No results found for: LITHIUM No results found for: VALPROATE No components found for:  CBMZ  Current Medications: Current Outpatient Medications  Medication Sig Dispense Refill  . albuterol (PROVENTIL HFA;VENTOLIN HFA) 108 (90 Base) MCG/ACT inhaler Inhale 1-2 puffs into the lungs every 6 (six) hours as needed for wheezing or shortness of breath. 1 Inhaler 0  . cetirizine (ZYRTEC) 10 MG tablet Take 10 mg by mouth daily.    Marland Kitchen desogestrel-ethinyl estradiol (MIRCETTE) 0.15-0.02/0.01 MG (21/5) tablet Take 1 tablet by mouth daily. 1 Package 11  . DULoxetine (CYMBALTA) 60 MG capsule Take 1 capsule (60 mg total) by mouth at bedtime. 30 capsule 2  . hydrOXYzine (ATARAX/VISTARIL) 25 MG tablet Take 1 tablet (25 mg total) by mouth 2 (two) times daily as needed for anxiety. 60 tablet 1   No current facility-administered medications for this visit.     Psychiatric Specialty Exam: Review of Systems  There were no vitals taken for this visit.There is no height or weight on file to calculate BMI.  General Appearance: Fairly Groomed  Eye Contact:  Good  Speech:  Clear and Coherent and Normal Rate  Volume:  Normal  Mood:  Euthymic  Affect:  Congruent  Thought Process:  Goal Directed and Descriptions of Associations: Intact  Orientation:  Full (Time, Place, and Person)  Thought Content: Logical   Suicidal Thoughts:  No  Homicidal Thoughts:  No  Memory:  Immediate;   Good Recent;   Good  Judgement:  Fair  Insight:  Fair  Psychomotor Activity:  Normal  Concentration:  Concentration: Good and Attention Span: Good  Recall:  Good  Fund of Knowledge: Good  Language: Good  Akathisia:  No  Handed:  Right  AIMS (if indicated): not done  Assets:  Communication Skills Desire for Improvement Financial Resources/Insurance Housing  ADL's:   Intact  Cognition: WNL  Sleep:  Good     Assessment and Plan: Patient has noticed improvement in her anxiety after discontinuing her birth control pill however has to start another one for contraception purposes.  She requested for as needed dose of hydroxyzine for breakthrough anxiety attacks.  1. Recurrent major depressive disorder, in full remission (HCC)  - DULoxetine (CYMBALTA) 60 MG capsule; Take 1 capsule (60 mg total) by mouth at bedtime.  Dispense: 30 capsule; Refill: 2  2. Anxiety  - DULoxetine (CYMBALTA) 60 MG capsule; Take 1 capsule (60 mg total) by mouth at bedtime.  Dispense: 30 capsule; Refill: 2 - hydrOXYzine (ATARAX/VISTARIL) 25 MG tablet;  Take 1 tablet (25 mg total) by mouth 2 (two) times daily as needed for anxiety.  Dispense: 60 tablet; Refill: 1    Continue same medication regimen. Follow up in 3 months.   Zena Amos, MD 05/16/2020, 8:46 AM

## 2020-07-11 ENCOUNTER — Ambulatory Visit (HOSPITAL_COMMUNITY)
Admission: EM | Admit: 2020-07-11 | Discharge: 2020-07-11 | Disposition: A | Discharge status: Home / Self Care | Payer: Self-pay | Attending: Family Medicine | Admitting: Family Medicine

## 2020-07-11 ENCOUNTER — Other Ambulatory Visit: Payer: Self-pay

## 2020-07-11 ENCOUNTER — Encounter (HOSPITAL_COMMUNITY): Payer: Self-pay | Admitting: Emergency Medicine

## 2020-07-11 DIAGNOSIS — F419 Anxiety disorder, unspecified: Secondary | ICD-10-CM

## 2020-07-11 MED ORDER — HYDROXYZINE HCL 25 MG PO TABS: 25.0000 mg | ORAL_TABLET | Freq: Two times a day (BID) | ORAL | 1 refills | Status: DC | PRN

## 2020-07-11 NOTE — ED Triage Notes (Signed)
Patient has had difficulty with anxiety since January.  Reports having panic attack at night.  Patient looked in mirror this morning and is convinced pupils were dilated, then looked at them later and they were normal.  Patient then went back later and looked at eyes and felt pupils were dilated again.  Patient reports having a headache and is crying in treatment room

## 2020-07-11 NOTE — ED Provider Notes (Signed)
[facility]    CSN: [id number] Arrival date & time: 07/11/20  1452      History   Chief Complaint Chief Complaint  Patient presents with  . Anxiety    HPI Judith Hansen is a 29 y.o. female.   HPI   29 year old woman with longstanding history of depression and anxiety.  Is under the care of behavioral health.  Is on duloxetine.  Has a prescription for hydroxyzine.  Has not gotten this filled because she is afraid of taking new medications.  States that she is under a lot of stress.  She has a fianc.  She is planning a wedding.  She has a stressful job.  She reports her fianc will leave her because of her mental illness.  She states that she is "sensitive".  She states that she had a bit of a panic reaction last night.  When she got up this morning elected herself in the mirror her eyes were dilated.  She states that "my pupils were blown".  Later when she lifted herself there were normal.  She lifted herself another time when she felt the large.  This is frightened her.  She thinks she is seriously sick.  She thinks her something wrong with her brain.  During the day she has been crying off and on all day.  She has developed a headache.  This has further reinforced her idea that there is something wrong with her brain.  No visual symptoms.  No head injury.  No recent illness.  No fever chills.  No problems with balance or strength.  No numbness or weakness.  Past Medical History:  Diagnosis Date  . ADD (attention deficit disorder)   . Anxiety   . Asthma   . Depression     Patient Active Problem List   Diagnosis Date Noted  . Recurrent major depressive disorder, in full remission (HCC) 03/14/2020  . Episode of recurrent major depressive disorder (HCC) 03/14/2020  . Smoker 03/28/2014  . Obesity 03/28/2014  . Anxiety   . Asthma   . Current episode of major depressive disorder without prior episode   . ADD (attention deficit disorder)     Past Surgical History:    Procedure Laterality Date  . FRACTURE SURGERY  2004   rt arm has plate  . ORIF FOREARM FRACTURE Right   . WISDOM TOOTH EXTRACTION      OB History    Gravida  0   Para  0   Term  0   Preterm  0   AB  0   Living  0     SAB  0   TAB  0   Ectopic  0   Multiple  0   Live Births  0            Home Medications    Prior to Admission medications   Medication Sig Start Date End Date Taking? Authorizing Provider  DULoxetine (CYMBALTA) 60 MG capsule Take 1 capsule (60 mg total) by mouth at bedtime. 05/16/20  Yes Zena Amos, MD  albuterol (PROVENTIL HFA;VENTOLIN HFA) 108 (90 Base) MCG/ACT inhaler Inhale 1-2 puffs into the lungs every 6 (six) hours as needed for wheezing or shortness of breath. 12/02/15   Charlsie Quest, MD  cetirizine (ZYRTEC) 10 MG tablet Take 10 mg by mouth daily.    [provider]  desogestrel-ethinyl estradiol (MIRCETTE) 0.15-0.02/0.01 MG (21/5) tablet Take 1 tablet by mouth daily. 04/10/17   Allayne Butcher  B, PA-C  hydrOXYzine (ATARAX/VISTARIL) 25 MG tablet Take 1 tablet (25 mg total) by mouth 2 (two) times daily as needed for anxiety. 07/11/20   Eustace Moore, MD    Family History Family History  Problem Relation Age of Onset  . Alcohol abuse Mother   . Arthritis Mother   . Depression Mother   . Heart disease Maternal Grandfather     Social History Social History   Tobacco Use  . Smoking status: Former Smoker    Packs/day: 0.00    Quit date: 12/02/2015    Years since quitting: 4.6  . Smokeless tobacco: Never Used  Vaping Use  . Vaping Use: Never used  Substance Use Topics  . Alcohol use: Yes    Comment: occasional  . Drug use: No     Allergies   Patient has no known allergies.   Review of Systems Review of Systems See HPI  Physical Exam Triage Vital Signs ED Triage Vitals  Enc Vitals Group     BP 07/11/20 1616 (!) 135/95     Pulse Rate 07/11/20 1616 96     Resp 07/11/20 1616 (!) 22     Temp 07/11/20 1616  98.6 F (37 C)     Temp Source 07/11/20 1616 Oral     SpO2 07/11/20 1616 99 %     Weight --      Height --      Head Circumference --      Peak Flow --      Pain Score 07/11/20 1613 6     Pain Loc --      Pain Edu? --      Excl. in GC? --    No data found.  Updated Vital Signs BP (!) 135/95 (BP Location: Right Arm) Comment (BP Location): large cuff  Pulse 96   Temp 98.6 F (37 C) (Oral)   Resp (!) 22 Comment: crying  LMP 07/04/2020   SpO2 99%       Physical Exam Constitutional:      General: She is not in acute distress.    Appearance: She is well-developed.     Comments: Overweight.  Anxious  HENT:     Head: Normocephalic and atraumatic.     Right Ear: Tympanic membrane and ear canal normal.     Left Ear: Tympanic membrane and ear canal normal.     Nose: No rhinorrhea.     Mouth/Throat:     Mouth: Mucous membranes are moist.     Pharynx: No posterior oropharyngeal erythema.  Eyes:     Conjunctiva/sclera: Conjunctivae normal.     Pupils: Pupils are equal, round, and reactive to light.     Comments: Pupils are equal and reactive  Cardiovascular:     Rate and Rhythm: Normal rate.  Pulmonary:     Effort: Pulmonary effort is normal. No respiratory distress.  Abdominal:     General: There is no distension.     Palpations: Abdomen is soft.  Musculoskeletal:        General: Normal range of motion.     Cervical back: Normal range of motion.  Skin:    General: Skin is warm and dry.  Neurological:     Mental Status: She is alert.     Cranial Nerves: No cranial nerve deficit.     Sensory: No sensory deficit.     Motor: No weakness.     Coordination: Coordination normal.     Deep Tendon Reflexes: Reflexes  normal.  Psychiatric:        Mood and Affect: Mood normal.        Behavior: Behavior normal.      UC Treatments / Results  Labs (all labs ordered are listed, but only abnormal results are displayed) Labs Reviewed - No data to  display  EKG   Radiology No results found.  Procedures Procedures (including critical care time)  Medications Ordered in UC Medications - No data to display  Initial Impression / Assessment and Plan / UC Course  I have reviewed the triage vital signs and the nursing notes.  Pertinent labs & imaging results that were available during my care of the patient were reviewed by me and considered in my medical decision making (see chart for details).     Normal physical examination.  Normal neurologic examination.  Explained to the patient that her pupillary reaction was likely due to stress.  No new medicines.  No other physical explanation.  She is advised to follow-up with her mental health provider.Reassurance offered Final Clinical Impressions(s) / UC Diagnoses   Final diagnoses:  Anxiety     Discharge Instructions     Talk to your medical provider about your dose of duloxetine (Cymbalta) Do not be afraid to take your hydroxyzine.  I resend that prescription to your pharmacy If you have panic or an emergency need to see someone, go to the behavioral health urgent care center   ED Prescriptions    Medication Sig Dispense Auth. Provider   hydrOXYzine (ATARAX/VISTARIL) 25 MG tablet Take 1 tablet (25 mg total) by mouth 2 (two) times daily as needed for anxiety. 60 tablet Eustace Moore, MD     PDMP not reviewed this encounter.   Eustace Moore, MD 07/11/20 2051

## 2020-07-11 NOTE — Discharge Instructions (Signed)
Talk to your medical provider about your dose of duloxetine (Cymbalta) Do not be afraid to take your hydroxyzine.  I resend that prescription to your pharmacy If you have panic or an emergency need to see someone, go to the behavioral health urgent care center

## 2020-08-14 ENCOUNTER — Telehealth (INDEPENDENT_AMBULATORY_CARE_PROVIDER_SITE_OTHER): Payer: No Payment, Other | Admitting: Psychiatry

## 2020-08-14 ENCOUNTER — Encounter (HOSPITAL_COMMUNITY): Payer: Self-pay | Admitting: Psychiatry

## 2020-08-14 ENCOUNTER — Other Ambulatory Visit: Payer: Self-pay

## 2020-08-14 DIAGNOSIS — F419 Anxiety disorder, unspecified: Secondary | ICD-10-CM

## 2020-08-14 DIAGNOSIS — F3342 Major depressive disorder, recurrent, in full remission: Secondary | ICD-10-CM | POA: Diagnosis not present

## 2020-08-14 MED ORDER — HYDROXYZINE HCL 25 MG PO TABS: 25.0000 mg | ORAL_TABLET | Freq: Two times a day (BID) | ORAL | 1 refills | Status: DC | PRN

## 2020-08-14 MED ORDER — DULOXETINE HCL 60 MG PO CPEP: 60.0000 mg | ORAL_CAPSULE | Freq: Every day | ORAL | 2 refills | Status: DC

## 2020-08-14 NOTE — Progress Notes (Signed)
BH MD/PA/NP OP Progress Note  Virtual Visit via Video Note  I connected with Judith Hansen on 08/14/20 at 11:00 AM EST by a video enabled telemedicine application and verified that I am speaking with the correct person using two identifiers.  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 The patient expressed understanding and agreed to proceed.  I provided 14 minutes of non-face-to-face time during this encounter.     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  She stated that overall things going well for her.  She informed that after starting her birth control her anxiety is better and she feels like she is on the right track.  She found the increase in the dose of hydroxyzine as needed to be helpful and requested refills for the same. She requested a appointment for 2 months this time and stated that she will contact the office if she needs anything more urgently.   Visit Diagnosis:    ICD-10-CM   1. Recurrent major depressive disorder, in full remission (HCC)  F33.42   2. Anxiety  F41.9     Past Psychiatric History: depression, anxiety  Past Medical History:  Past Medical History:  Diagnosis Date  . ADD (attention deficit disorder)   . Anxiety   . Asthma   . Depression     Past Surgical History:  Procedure Laterality Date  . FRACTURE SURGERY  2004   rt arm has plate  . ORIF FOREARM FRACTURE Right   . WISDOM TOOTH EXTRACTION      Family Psychiatric History: see below  Family History:  Family History  Problem Relation Age of Onset  . Alcohol abuse Mother   . Arthritis Mother   . Depression Mother   . Heart disease Maternal Grandfather     Social History:  Social History    Socioeconomic History  . Marital status: Single    Spouse name: Not on file  . Number of children: Not on file  . Years of education: Not on file  . Highest education level: Not on file  Occupational History  . Not on file  Tobacco Use  . Smoking status: Former Smoker    Packs/day: 0.00    Quit date: 12/02/2015    Years since quitting: 4.7  . Smokeless tobacco: Never Used  Vaping Use  . Vaping Use: Never used  Substance and Sexual Activity  . Alcohol use: Yes    Comment: occasional  . Drug use: No  . Sexual activity: Yes    Birth control/protection: None  Other Topics Concern  . Not on file  Social History Narrative   Entered 03/2014:   Works 2nd shift at [employer] with her mom   Social Determinants of Corporate investment banker Strain: Not on [employer] Insecurity: Not on file  Transportation Needs: Not on file  Physical Activity: Not on file  Stress: Not on file  Social Connections: Not on file    Allergies: No Known Allergies  Metabolic Disorder Labs: No results found for: HGBA1C, MPG No results found for: PROLACTIN No results found for: CHOL, TRIG, HDL, CHOLHDL, VLDL, LDLCALC No results found for: TSH  Therapeutic Level Labs: No  results found for: LITHIUM No results found for: VALPROATE No components found for:  CBMZ  Current Medications: Current Outpatient Medications  Medication Sig Dispense Refill  . albuterol (PROVENTIL HFA;VENTOLIN HFA) 108 (90 Base) MCG/ACT inhaler Inhale 1-2 puffs into the lungs every 6 (six) hours as needed for wheezing or shortness of breath. 1 Inhaler 0  . cetirizine (ZYRTEC) 10 MG tablet Take 10 mg by mouth daily.    Marland Kitchen desogestrel-ethinyl estradiol (MIRCETTE) 0.15-0.02/0.01 MG (21/5) tablet Take 1 tablet by mouth daily. 1 Package 11  . DULoxetine (CYMBALTA) 60 MG capsule Take 1 capsule (60 mg total) by mouth at bedtime. 30 capsule 2  . hydrOXYzine (ATARAX/VISTARIL) 25 MG tablet Take 1 tablet (25 mg total) by mouth  2 (two) times daily as needed for anxiety. 60 tablet 1   No current facility-administered medications for this visit.     Psychiatric Specialty Exam: Review of Systems  There were no vitals taken for this visit.There is no height or weight on file to calculate BMI.  General Appearance: Fairly Groomed  Eye Contact:  Good  Speech:  Clear and Coherent and Normal Rate  Volume:  Normal  Mood:  Euthymic  Affect:  Congruent  Thought Process:  Goal Directed and Descriptions of Associations: Intact  Orientation:  Full (Time, Place, and Person)  Thought Content: Logical   Suicidal Thoughts:  No  Homicidal Thoughts:  No  Memory:  Immediate;   Good Recent;   Good  Judgement:  Fair  Insight:  Fair  Psychomotor Activity:  Normal  Concentration:  Concentration: Good and Attention Span: Good  Recall:  Good  Fund of Knowledge: Good  Language: Good  Akathisia:  No  Handed:  Right  AIMS (if indicated): not done  Assets:  Communication Skills Desire for Improvement Financial Resources/Insurance Housing  ADL's:  Intact  Cognition: WNL  Sleep:  Good     Assessment and Plan: Patient reported improvement in her anxiety after restarting her birth control.  She would like to keep things the way they are for now.  1. Recurrent major depressive disorder, in full remission (HCC)  - DULoxetine (CYMBALTA) 60 MG capsule; Take 1 capsule (60 mg total) by mouth at bedtime.  Dispense: 30 capsule; Refill: 2  2. Anxiety  - DULoxetine (CYMBALTA) 60 MG capsule; Take 1 capsule (60 mg total) by mouth at bedtime.  Dispense: 30 capsule; Refill: 2 - hydrOXYzine (ATARAX/VISTARIL) 25 MG tablet; Take 1 tablet (25 mg total) by mouth 2 (two) times daily as needed for anxiety.  Dispense: 60 tablet; Refill: 1    Continue same medication regimen. Follow up in 3 months.   Zena Amos, MD 08/14/2020, 11:02 AM

## 2020-10-12 ENCOUNTER — Other Ambulatory Visit: Payer: Self-pay

## 2020-10-12 ENCOUNTER — Encounter (HOSPITAL_COMMUNITY): Payer: Self-pay | Admitting: Psychiatry

## 2020-10-12 ENCOUNTER — Telehealth (INDEPENDENT_AMBULATORY_CARE_PROVIDER_SITE_OTHER): Payer: No Payment, Other | Admitting: Psychiatry

## 2020-10-12 DIAGNOSIS — F3342 Major depressive disorder, recurrent, in full remission: Secondary | ICD-10-CM | POA: Diagnosis not present

## 2020-10-12 DIAGNOSIS — F419 Anxiety disorder, unspecified: Secondary | ICD-10-CM | POA: Diagnosis not present

## 2020-10-12 MED ORDER — DULOXETINE HCL 60 MG PO CPEP: 60.0000 mg | ORAL_CAPSULE | Freq: Every day | ORAL | 2 refills | Status: DC

## 2020-10-12 MED ORDER — HYDROXYZINE HCL 25 MG PO TABS: 25.0000 mg | ORAL_TABLET | Freq: Two times a day (BID) | ORAL | 2 refills | Status: DC | PRN

## 2020-10-12 NOTE — Progress Notes (Signed)
BH MD/PA/NP OP Progress Note  Virtual Visit via Video Note  I connected with Judith Hansen on 10/12/20 at  3:20 PM EST by a video enabled telemedicine application and verified that I am speaking with the correct person using two identifiers.  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 The patient expressed understanding and agreed to proceed.  I provided 16 minutes of non-face-to-face time during this encounter.     10/12/2020 3:02 PM Judith Hansen  MRN:  [id number]  Chief Complaint:  " Everything is going great."   HPI: Patient informed that everything has been going well for her.  She informed that the medications have really helped her mood and anxiety significantly.  She is no longer having frequent panic attacks.  She may have one episode of anxiety attack once in a while and that goes away after a few seconds.  She stated that she is still working regularly and things have been going well in that area too. She informed that she does have a lot going on in her personal life and she is looking forward to moving in with her significant other.  She also mentioned that they are planning to fix her wedding date and they will start working on the wedding planning soon. She stated that she is grateful for her life partner who is very helpful and supportive of her.  She thinks she is in a great spot in her life and she would like to keep things the way they are.  Visit Diagnosis:  No diagnosis found.  Past Psychiatric History: depression, anxiety  Past Medical History:  Past Medical History:  Diagnosis Date  . ADD (attention deficit disorder)   . Anxiety   . Asthma   . Depression     Past Surgical History:  Procedure Laterality Date  . FRACTURE SURGERY  2004   rt arm has plate  . ORIF FOREARM FRACTURE Right   . WISDOM TOOTH EXTRACTION       Family Psychiatric History: see below  Family History:  Family History  Problem Relation Age of Onset  . Alcohol abuse Mother   . Arthritis Mother   . Depression Mother   . Heart disease Maternal Grandfather     Social History:  Social History   Socioeconomic History  . Marital status: Single    Spouse name: Not on file  . Number of children: Not on file  . Years of education: Not on file  . Highest education level: Not on file  Occupational History  . Not on file  Tobacco Use  . Smoking status: Former Smoker    Packs/day: 0.00    Quit date: 12/02/2015    Years since quitting: 4.8  . Smokeless tobacco: Never Used  Vaping Use  . Vaping Use: Never used  Substance and Sexual Activity  . Alcohol use: Yes    Comment: occasional  . Drug use: No  . Sexual activity: Yes    Birth control/protection: None  Other Topics Concern  . Not on file  Social History Narrative   Entered 03/2014:   Works 2nd shift at [employer] with her mom   Social Determinants of Corporate investment banker Strain: Not on [employer] Insecurity: Not on file  Transportation Needs: Not on file  Physical Activity: Not on file  Stress: Not on file  Social Connections:  Not on file    Allergies: No Known Allergies  Metabolic Disorder Labs: No results found for: HGBA1C, MPG No results found for: PROLACTIN No results found for: CHOL, TRIG, HDL, CHOLHDL, VLDL, LDLCALC No results found for: TSH  Therapeutic Level Labs: No results found for: LITHIUM No results found for: VALPROATE No components found for:  CBMZ  Current Medications: Current Outpatient Medications  Medication Sig Dispense Refill  . albuterol (PROVENTIL HFA;VENTOLIN HFA) 108 (90 Base) MCG/ACT inhaler Inhale 1-2 puffs into the lungs every 6 (six) hours as needed for wheezing or shortness of breath. 1 Inhaler 0  . cetirizine (ZYRTEC) 10 MG tablet Take 10 mg by mouth daily.    Marland Kitchen desogestrel-ethinyl estradiol (MIRCETTE)  0.15-0.02/0.01 MG (21/5) tablet Take 1 tablet by mouth daily. 1 Package 11  . DULoxetine (CYMBALTA) 60 MG capsule Take 1 capsule (60 mg total) by mouth at bedtime. 30 capsule 2  . hydrOXYzine (ATARAX/VISTARIL) 25 MG tablet Take 1 tablet (25 mg total) by mouth 2 (two) times daily as needed for anxiety. 60 tablet 1   No current facility-administered medications for this visit.     Psychiatric Specialty Exam: Review of Systems  There were no vitals taken for this visit.There is no height or weight on file to calculate BMI.  General Appearance: Fairly Groomed  Eye Contact:  Good  Speech:  Clear and Coherent and Normal Rate  Volume:  Normal  Mood:  Euthymic  Affect:  Congruent  Thought Process:  Goal Directed and Descriptions of Associations: Intact  Orientation:  Full (Time, Place, and Person)  Thought Content: Logical   Suicidal Thoughts:  No  Homicidal Thoughts:  No  Memory:  Immediate;   Good Recent;   Good  Judgement:  Fair  Insight:  Fair  Psychomotor Activity:  Normal  Concentration:  Concentration: Good and Attention Span: Good  Recall:  Good  Fund of Knowledge: Good  Language: Good  Akathisia:  No  Handed:  Right  AIMS (if indicated): not done  Assets:  Communication Skills Desire for Improvement Financial Resources/Insurance Housing  ADL's:  Intact  Cognition: WNL  Sleep:  Good     Assessment and Plan: Patient is stable on current regimen.  1. Recurrent major depressive disorder, in full remission (HCC)  - DULoxetine (CYMBALTA) 60 MG capsule; Take 1 capsule (60 mg total) by mouth at bedtime.  Dispense: 30 capsule; Refill: 2  2. Anxiety  - DULoxetine (CYMBALTA) 60 MG capsule; Take 1 capsule (60 mg total) by mouth at bedtime.  Dispense: 30 capsule; Refill: 2 - hydrOXYzine (ATARAX/VISTARIL) 25 MG tablet; Take 1 tablet (25 mg total) by mouth 2 (two) times daily as needed for anxiety.  Dispense: 60 tablet; Refill: 1    Continue same medication regimen. Follow  up in 3 months.   Zena Amos, MD 10/12/2020, 3:02 PM

## 2021-01-04 ENCOUNTER — Encounter (HOSPITAL_COMMUNITY): Payer: Self-pay | Admitting: Psychiatry

## 2021-01-04 ENCOUNTER — Encounter (HOSPITAL_COMMUNITY): Payer: Self-pay

## 2021-01-04 ENCOUNTER — Telehealth (INDEPENDENT_AMBULATORY_CARE_PROVIDER_SITE_OTHER): Payer: No Payment, Other | Admitting: Psychiatry

## 2021-01-04 ENCOUNTER — Ambulatory Visit (HOSPITAL_COMMUNITY)
Admission: EM | Admit: 2021-01-04 | Discharge: 2021-01-04 | Disposition: A | Discharge status: Home / Self Care | Payer: Self-pay

## 2021-01-04 ENCOUNTER — Other Ambulatory Visit: Payer: Self-pay

## 2021-01-04 DIAGNOSIS — F419 Anxiety disorder, unspecified: Secondary | ICD-10-CM | POA: Diagnosis not present

## 2021-01-04 DIAGNOSIS — F3342 Major depressive disorder, recurrent, in full remission: Secondary | ICD-10-CM | POA: Diagnosis not present

## 2021-01-04 DIAGNOSIS — N3001 Acute cystitis with hematuria: Secondary | ICD-10-CM

## 2021-01-04 DIAGNOSIS — B9689 Other specified bacterial agents as the cause of diseases classified elsewhere: Secondary | ICD-10-CM

## 2021-01-04 HISTORY — DX: Other seasonal allergic rhinitis: J30.2

## 2021-01-04 LAB — POCT URINALYSIS DIPSTICK, ED / UC
Bilirubin Urine: NEGATIVE
Glucose, UA: NEGATIVE mg/dL
Ketones, ur: NEGATIVE mg/dL
Nitrite: NEGATIVE
Protein, ur: 100 mg/dL — AB
Specific Gravity, Urine: 1.025 (ref 1.005–1.030)
Urobilinogen, UA: 0.2 mg/dL (ref 0.0–1.0)
pH: 5 (ref 5.0–8.0)

## 2021-01-04 MED ORDER — FLUCONAZOLE 150 MG PO TABS: 150.0000 mg | ORAL_TABLET | Freq: Every day | ORAL | 0 refills | Status: DC

## 2021-01-04 MED ORDER — NITROFURANTOIN MONOHYD MACRO 100 MG PO CAPS: 100.0000 mg | ORAL_CAPSULE | Freq: Two times a day (BID) | ORAL | 0 refills | Status: AC

## 2021-01-04 MED ORDER — HYDROXYZINE HCL 25 MG PO TABS: 25.0000 mg | ORAL_TABLET | Freq: Two times a day (BID) | ORAL | 2 refills | Status: DC | PRN

## 2021-01-04 MED ORDER — DULOXETINE HCL 60 MG PO CPEP: 60.0000 mg | ORAL_CAPSULE | Freq: Every day | ORAL | 2 refills | Status: DC

## 2021-01-04 NOTE — Discharge Instructions (Addendum)
Take the macrobid twice a day for the next 5 days.    If you develop a yeast infection you can take one diflucan.  If the symptoms have not resolved after 3 days, take the second.    Continue to drink plenty of fluids, especially water.  You can drink cranberry juice, take AZO, or AZO cranberry for symptom relief.   Return or go to the Emergency Department if symptoms worsen or do not improve in the next few days.

## 2021-01-04 NOTE — ED Provider Notes (Signed)
[facility]    CSN: [id number] Arrival date & time: 01/04/21  1903      History   Chief Complaint Chief Complaint  Patient presents with  . urinary pain  . Urinary Frequency  . Back Pain  . Headache    HPI Judith Hansen is a 30 y.o. female.   Patient here for evaluation of urinary frequency, dysuria, lower back pain that has been ongoing for the past several days.  Also reports noticing a small amount of blood in urine.  Patient reports drinking cranberry juice with some symptom relief.  Denies any trauma, injury, or other precipitating event.  Denies any specific alleviating or aggravating factors.  Denies any fevers, chest pain, shortness of breath, N/V/D, numbness, tingling, weakness, abdominal pain, or headaches.     The history is provided by the patient.  Urinary Frequency Associated symptoms include headaches.  Back Pain Associated symptoms: dysuria and headaches   Headache Associated symptoms: back pain     Past Medical History:  Diagnosis Date  . ADD (attention deficit disorder)   . Anxiety   . Asthma   . Depression   . Seasonal allergies     Patient Active Problem List   Diagnosis Date Noted  . Recurrent major depressive disorder, in full remission (HCC) 03/14/2020  . Episode of recurrent major depressive disorder (HCC) 03/14/2020  . Smoker 03/28/2014  . Obesity 03/28/2014  . Anxiety   . Asthma   . Current episode of major depressive disorder without prior episode   . ADD (attention deficit disorder)     Past Surgical History:  Procedure Laterality Date  . FRACTURE SURGERY  2004   rt arm has plate  . ORIF FOREARM FRACTURE Right   . WISDOM TOOTH EXTRACTION      OB History    Gravida  0   Para  0   Term  0   Preterm  0   AB  0   Living  0     SAB  0   IAB  0   Ectopic  0   Multiple  0   Live Births  0            Home Medications    Prior to Admission medications   Medication Sig Start Date End Date  Taking? Authorizing Provider  albuterol (PROVENTIL HFA;VENTOLIN HFA) 108 (90 Base) MCG/ACT inhaler Inhale 1-2 puffs into the lungs every 6 (six) hours as needed for wheezing or shortness of breath. 12/02/15  Yes Charlsie Quest, MD  DULoxetine (CYMBALTA) 60 MG capsule Take 1 capsule (60 mg total) by mouth at bedtime. 01/04/21  Yes Zena Amos, MD  fluconazole (DIFLUCAN) 150 MG tablet Take 1 tablet (150 mg total) by mouth daily. Take one tablet now and one in 3 days if you are still having symptoms 01/04/21  Yes Ivette Loyal, NP  loratadine (CLARITIN) 10 MG tablet Take 10 mg by mouth as needed for allergies.   Yes [provider]  nitrofurantoin, macrocrystal-monohydrate, (MACROBID) 100 MG capsule Take 1 capsule (100 mg total) by mouth 2 (two) times daily for 5 days. 01/04/21 01/09/21 Yes Ivette Loyal, NP  cetirizine (ZYRTEC) 10 MG tablet Take 10 mg by mouth daily.    [provider]  desogestrel-ethinyl estradiol (MIRCETTE) 0.15-0.02/0.01 MG (21/5) tablet Take 1 tablet by mouth daily. 04/10/17   Allayne Butcher B, PA-C  hydrOXYzine (ATARAX/VISTARIL) 25 MG tablet Take 1 tablet (25 mg total) by mouth 2 (two)  times daily as needed for anxiety. 01/04/21   Zena Amos, MD    Family History Family History  Problem Relation Age of Onset  . Alcohol abuse Mother   . Arthritis Mother   . Depression Mother   . Heart disease Maternal Grandfather     Social History Social History   Tobacco Use  . Smoking status: Former Smoker    Packs/day: 0.00    Quit date: 12/02/2015    Years since quitting: 5.0  . Smokeless tobacco: Never Used  Vaping Use  . Vaping Use: Every day  Substance Use Topics  . Alcohol use: Yes    Comment: occasional  . Drug use: No     Allergies   Patient has no known allergies.   Review of Systems Review of Systems  Genitourinary: Positive for dysuria and frequency.  Musculoskeletal: Positive for back pain.  Neurological: Positive for headaches.  All other  systems reviewed and are negative.    Physical Exam Triage Vital Signs ED Triage Vitals  Enc Vitals Group     BP 01/04/21 2005 (!) 146/71     Pulse Rate 01/04/21 2005 85     Resp 01/04/21 2005 18     Temp 01/04/21 2005 98.7 F (37.1 C)     Temp src --      SpO2 01/04/21 2005 100 %     Weight --      Height --      Head Circumference --      Peak Flow --      Pain Score 01/04/21 2002 0     Pain Loc --      Pain Edu? --      Excl. in GC? --    No data found.  Updated Vital Signs BP (!) 146/71   Pulse 85   Temp 98.7 F (37.1 C)   Resp 18   LMP 12/16/2020   SpO2 100%   Visual Acuity Right Eye Distance:   Left Eye Distance:   Bilateral Distance:    Right Eye Near:   Left Eye Near:    Bilateral Near:     Physical Exam Vitals and nursing note reviewed.  Constitutional:      General: She is not in acute distress.    Appearance: Normal appearance. She is not ill-appearing, toxic-appearing or diaphoretic.  HENT:     Head: Normocephalic and atraumatic.     Nose: Nose normal.  Eyes:     Conjunctiva/sclera: Conjunctivae normal.  Cardiovascular:     Rate and Rhythm: Normal rate.     Pulses: Normal pulses.  Pulmonary:     Effort: Pulmonary effort is normal.  Abdominal:     General: Abdomen is flat.  Musculoskeletal:        General: Normal range of motion.     Cervical back: Normal range of motion.  Skin:    General: Skin is warm and dry.  Neurological:     General: No focal deficit present.     Mental Status: She is alert and oriented to person, place, and time.     GCS: GCS eye subscore is 4. GCS verbal subscore is 5. GCS motor subscore is 6.  Psychiatric:        Mood and Affect: Mood normal.      UC Treatments / Results  Labs (all labs ordered are listed, but only abnormal results are displayed) Labs Reviewed  POCT URINALYSIS DIPSTICK, ED / UC - Abnormal; Notable for the following  components:      Result Value   Hgb urine dipstick MODERATE (*)     Protein, ur 100 (*)    Leukocytes,Ua SMALL (*)    All other components within normal limits    EKG   Radiology No results found.  Procedures Procedures (including critical care time)  Medications Ordered in UC Medications - No data to display  Initial Impression / Assessment and Plan / UC Course  I have reviewed the triage vital signs and the nursing notes.  Pertinent labs & imaging results that were available during my care of the patient were reviewed by me and considered in my medical decision making (see chart for details).     Assessment negative for red flags or concerns.  Urinalysis positive for protein hemoglobin and leukocytes.  Based on symptoms will treat with Macrobid twice daily for the next 5 days.  Also prescribed Diflucan in case patient develops a yeast infection from antibiotic use.  May continue to take AZO, AZO cranberry or drink cranberry juice for symptom relief.  Encourage fluids and rest.  Follow-up as needed.  Final Clinical Impressions(s) / UC Diagnoses   Final diagnoses:  Acute cystitis with hematuria     Discharge Instructions     Take the macrobid twice a day for the next 5 days.    If you develop a yeast infection you can take one diflucan.  If the symptoms have not resolved after 3 days, take the second.    Continue to drink plenty of fluids, especially water.  You can drink cranberry juice, take AZO, or AZO cranberry for symptom relief.   Return or go to the Emergency Department if symptoms worsen or do not improve in the next few days.      ED Prescriptions    Medication Sig Dispense Auth. Provider   nitrofurantoin, macrocrystal-monohydrate, (MACROBID) 100 MG capsule Take 1 capsule (100 mg total) by mouth 2 (two) times daily for 5 days. 10 capsule Ivette Loyal, NP   fluconazole (DIFLUCAN) 150 MG tablet Take 1 tablet (150 mg total) by mouth daily. Take one tablet now and one in 3 days if you are still having symptoms 2 tablet Ivette Loyal, NP     PDMP not reviewed this encounter.   Ivette Loyal, NP 01/04/21 2042

## 2021-01-04 NOTE — Progress Notes (Signed)
BH MD/PA/NP OP Progress Note  Virtual Visit via Video Note  I connected with Judith Hansen on 01/04/21 at  2:40 PM EDT by a video enabled telemedicine application and verified that I am speaking with the correct person using two identifiers.  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 The patient expressed understanding and agreed to proceed.  I provided 14 minutes of non-face-to-face time during this encounter.     01/04/2021 2:40 PM Judith Hansen  MRN:  [id number]  Chief Complaint:  " Everything is fine."   HPI: Patient reported she is doing well.  She was at work during the session.  She informed that things are going well in her professional as well as personal life. She has not finalized the wedding date yet but she and her partner are discussing about the possibility of having it in the spring of next year. She stated that she and her partner still have some other things to take care of before they finalized the day. She reported that she has had a few panic attacks here and there but nothing too severe or frequent. She denies any other issues or concerns today and reported that her mood has been stable on the medicines.    Visit Diagnosis:    ICD-10-CM   1. Recurrent major depressive disorder, in full remission (HCC)  F33.42   2. Anxiety  F41.9     Past Psychiatric History: depression, anxiety  Past Medical History:  Past Medical History:  Diagnosis Date  . ADD (attention deficit disorder)   . Anxiety   . Asthma   . Depression     Past Surgical History:  Procedure Laterality Date  . FRACTURE SURGERY  2004   rt arm has plate  . ORIF FOREARM FRACTURE Right   . WISDOM TOOTH EXTRACTION      Family Psychiatric History: see below  Family History:  Family History  Problem Relation Age of Onset  . Alcohol abuse Mother   .  Arthritis Mother   . Depression Mother   . Heart disease Maternal Grandfather     Social History:  Social History   Socioeconomic History  . Marital status: Single    Spouse name: Not on file  . Number of children: Not on file  . Years of education: Not on file  . Highest education level: Not on file  Occupational History  . Not on file  Tobacco Use  . Smoking status: Former Smoker    Packs/day: 0.00    Quit date: 12/02/2015    Years since quitting: 5.0  . Smokeless tobacco: Never Used  Vaping Use  . Vaping Use: Never used  Substance and Sexual Activity  . Alcohol use: Yes    Comment: occasional  . Drug use: No  . Sexual activity: Yes    Birth control/protection: None  Other Topics Concern  . Not on file  Social History Narrative   Entered 03/2014:   Works 2nd shift at [employer] with her mom   Social Determinants of Corporate investment banker Strain: Not on [employer] Insecurity: Not on file  Transportation Needs: Not on file  Physical Activity: Not on file  Stress: Not on file  Social Connections: Not on file    Allergies: No Known Allergies  Metabolic Disorder Labs: No results found for: HGBA1C, MPG No results found for:  PROLACTIN No results found for: CHOL, TRIG, HDL, CHOLHDL, VLDL, LDLCALC No results found for: TSH  Therapeutic Level Labs: No results found for: LITHIUM No results found for: VALPROATE No components found for:  CBMZ  Current Medications: Current Outpatient Medications  Medication Sig Dispense Refill  . albuterol (PROVENTIL HFA;VENTOLIN HFA) 108 (90 Base) MCG/ACT inhaler Inhale 1-2 puffs into the lungs every 6 (six) hours as needed for wheezing or shortness of breath. 1 Inhaler 0  . cetirizine (ZYRTEC) 10 MG tablet Take 10 mg by mouth daily.    Marland Kitchen desogestrel-ethinyl estradiol (MIRCETTE) 0.15-0.02/0.01 MG (21/5) tablet Take 1 tablet by mouth daily. 1 Package 11  . DULoxetine (CYMBALTA) 60 MG capsule Take 1 capsule (60 mg  total) by mouth at bedtime. 30 capsule 2  . hydrOXYzine (ATARAX/VISTARIL) 25 MG tablet Take 1 tablet (25 mg total) by mouth 2 (two) times daily as needed for anxiety. 60 tablet 2   No current facility-administered medications for this visit.     Psychiatric Specialty Exam: Review of Systems  There were no vitals taken for this visit.There is no height or weight on file to calculate BMI.  General Appearance: Fairly Groomed  Eye Contact:  Good  Speech:  Clear and Coherent and Normal Rate  Volume:  Normal  Mood:  Euthymic  Affect:  Congruent  Thought Process:  Goal Directed and Descriptions of Associations: Intact  Orientation:  Full (Time, Place, and Person)  Thought Content: Logical   Suicidal Thoughts:  No  Homicidal Thoughts:  No  Memory:  Immediate;   Good Recent;   Good  Judgement:  Fair  Insight:  Fair  Psychomotor Activity:  Normal  Concentration:  Concentration: Good and Attention Span: Good  Recall:  Good  Fund of Knowledge: Good  Language: Good  Akathisia:  No  Handed:  Right  AIMS (if indicated): not done  Assets:  Communication Skills Desire for Improvement Financial Resources/Insurance Housing  ADL's:  Intact  Cognition: WNL  Sleep:  Good     Assessment and Plan: Patient appears to be stable on her current regimen.  We will keep the same medications for now.  1. Recurrent major depressive disorder, in full remission (HCC)  - DULoxetine (CYMBALTA) 60 MG capsule; Take 1 capsule (60 mg total) by mouth at bedtime.  Dispense: 30 capsule; Refill: 2  2. Anxiety  - DULoxetine (CYMBALTA) 60 MG capsule; Take 1 capsule (60 mg total) by mouth at bedtime.  Dispense: 30 capsule; Refill: 2 - hydrOXYzine (ATARAX/VISTARIL) 25 MG tablet; Take 1 tablet (25 mg total) by mouth 2 (two) times daily as needed for anxiety.  Dispense: 60 tablet; Refill: 1    Continue same medication regimen. Follow up in 3 months.   Zena Amos, MD 01/04/2021, 2:40 PM

## 2021-01-04 NOTE — ED Triage Notes (Signed)
Pt reports urinary frequency, low back pain, headache, pink on toilet paper when wiping, urinary pain x2-3 days.

## 2021-04-06 ENCOUNTER — Telehealth (INDEPENDENT_AMBULATORY_CARE_PROVIDER_SITE_OTHER): Payer: No Payment, Other | Admitting: Psychiatry

## 2021-04-06 ENCOUNTER — Encounter (HOSPITAL_COMMUNITY): Payer: Self-pay | Admitting: Psychiatry

## 2021-04-06 ENCOUNTER — Other Ambulatory Visit: Payer: Self-pay

## 2021-04-06 DIAGNOSIS — F3342 Major depressive disorder, recurrent, in full remission: Secondary | ICD-10-CM | POA: Diagnosis not present

## 2021-04-06 DIAGNOSIS — F419 Anxiety disorder, unspecified: Secondary | ICD-10-CM

## 2021-04-06 MED ORDER — DULOXETINE HCL 60 MG PO CPEP: 60.0000 mg | ORAL_CAPSULE | Freq: Every day | ORAL | 3 refills | Status: DC

## 2021-04-06 MED ORDER — HYDROXYZINE HCL 25 MG PO TABS: 25.0000 mg | ORAL_TABLET | Freq: Two times a day (BID) | ORAL | 3 refills | Status: DC | PRN

## 2021-04-06 NOTE — Progress Notes (Signed)
BH MD/PA/NP OP Progress Note Virtual Visit via Video Note  I connected with Judith Hansen on 04/06/21 at 11:00 AM EDT by a video enabled telemedicine application and verified that I am speaking with the correct person using two identifiers.  Location: Patient: Home Provider: Clinic   I discussed the limitations of evaluation and management by telemedicine and the availability of in person appointments. The patient expressed understanding and agreed to proceed.  I provided 30  minutes of non-face-to-face time during this encounter.   04/06/2021 11:24 AM Carmalita Wakefield  MRN:  [id number]  Chief Complaint: "I haven't had to take my hydroxyzine as much"  HPI: 30 year old female seen today for follow up psychiatric evaluation. She is a former patient of Dr. Evelene Croon who is being transferred to Clinical research associate for medication management. She has a psychiatric history of depression and anxiety. She is currently managed on Hydroxyzine 25 mg twice daily and Cymbalta 60 mg nightly. She notes that her medications are effective in managing her psychiatric conditions.  Today she is pleasant, cooperative, engaged in conversation, and maintained eye contact. She notes that since her last visit she has been doing well. Sh reports that at times she becomes anxious and has occasional panic attacks but notes that overall things have improved. She informed Clinical research associate that she has not had to take hydroxyzine as frequent. She notes since stopping her birth control in November her anxiety has gotten better. Today provider conducted a GAD 7 and patient scored an 11. Provider also conducted a PHQ 9 and patient scored a 6. She endorses adequate sleep and appetite. Today she denies SI/HI/VAH, mania, or paranoia.  Patient notes that she works as a Production designer, theatre/television/film at a [employer] and finds enjoyment in her job. She reports that she and her fiance are considering getting married in a court house and spending their extra funds on a nice honeymoon.    No medication changes made today. Patient agreeable to continue medications as prescribed. No other concerns notes at this time.  Visit Diagnosis:    ICD-10-CM   1. Anxiety  F41.9 hydrOXYzine (ATARAX/VISTARIL) 25 MG tablet    DULoxetine (CYMBALTA) 60 MG capsule    2. Recurrent major depressive disorder, in full remission (HCC)  F33.42 DULoxetine (CYMBALTA) 60 MG capsule      Past Psychiatric History: Anxiety and depression   Past Medical History:  Past Medical History:  Diagnosis Date   ADD (attention deficit disorder)    Anxiety    Asthma    Depression    Seasonal allergies     Past Surgical History:  Procedure Laterality Date   FRACTURE SURGERY  2004   rt arm has plate   ORIF FOREARM FRACTURE Right    WISDOM TOOTH EXTRACTION      Family Psychiatric History: Mother alcohol misuse and depression   Family History:  Family History  Problem Relation Age of Onset   Alcohol abuse Mother    Arthritis Mother    Depression Mother    Heart disease Maternal Grandfather     Social History:  Social History   Socioeconomic History   Marital status: Significant Other    Spouse name: Not on file   Number of children: Not on file   Years of education: Not on file   Highest education level: Not on file  Occupational History   Not on file  Tobacco Use   Smoking status: Former    Packs/day: 0.00    Types: Cigarettes  Quit date: 12/02/2015    Years since quitting: 5.3   Smokeless tobacco: Never  Vaping Use   Vaping Use: Every day  Substance and Sexual Activity   Alcohol use: Yes    Comment: occasional   Drug use: No   Sexual activity: Yes    Birth control/protection: None  Other Topics Concern   Not on file  Social History Narrative   Entered 03/2014:   Works 2nd shift at [employer] with her mom   Social Determinants of Corporate investment banker Strain: Not on [employer] Insecurity: Not on file  Transportation Needs: Not on file  Physical  Activity: Not on file  Stress: Not on file  Social Connections: Not on file    Allergies: No Known Allergies  Metabolic Disorder Labs: No results found for: HGBA1C, MPG No results found for: PROLACTIN No results found for: CHOL, TRIG, HDL, CHOLHDL, VLDL, LDLCALC No results found for: TSH  Therapeutic Level Labs: No results found for: LITHIUM No results found for: VALPROATE No components found for:  CBMZ  Current Medications: Current Outpatient Medications  Medication Sig Dispense Refill   albuterol (PROVENTIL HFA;VENTOLIN HFA) 108 (90 Base) MCG/ACT inhaler Inhale 1-2 puffs into the lungs every 6 (six) hours as needed for wheezing or shortness of breath. 1 Inhaler 0   cetirizine (ZYRTEC) 10 MG tablet Take 10 mg by mouth daily.     desogestrel-ethinyl estradiol (MIRCETTE) 0.15-0.02/0.01 MG (21/5) tablet Take 1 tablet by mouth daily. 1 Package 11   DULoxetine (CYMBALTA) 60 MG capsule Take 1 capsule (60 mg total) by mouth at bedtime. 30 capsule 3   fluconazole (DIFLUCAN) 150 MG tablet Take 1 tablet (150 mg total) by mouth daily. Take one tablet now and one in 3 days if you are still having symptoms 2 tablet 0   hydrOXYzine (ATARAX/VISTARIL) 25 MG tablet Take 1 tablet (25 mg total) by mouth 2 (two) times daily as needed for anxiety. 60 tablet 3   loratadine (CLARITIN) 10 MG tablet Take 10 mg by mouth as needed for allergies.     No current facility-administered medications for this visit.     Musculoskeletal: Strength & Muscle Tone:  Unable to assess due to telehealth visit Gait & Station:  Unable to assess due to telehealth visit Patient leans: N/A  Psychiatric Specialty Exam: Review of Systems  There were no vitals taken for this visit.There is no height or weight on file to calculate BMI.  General Appearance: Well Groomed  Eye Contact:  Good  Speech:  Clear and Coherent and Normal Rate  Volume:  Normal  Mood:  Euthymic  Affect:  Appropriate and Congruent  Thought Process:   Coherent, Goal Directed, and Linear  Orientation:  Full (Time, Place, and Person)  Thought Content: WDL and Logical   Suicidal Thoughts:  No  Homicidal Thoughts:  No  Memory:  Immediate;   Good Recent;   Good Remote;   Good  Judgement:  Good  Insight:  Good  Psychomotor Activity:  Normal  Concentration:  Concentration: Good and Attention Span: Good  Recall:  Good  Fund of Knowledge: Good  Language: Good  Akathisia:  No  Handed:  Right  AIMS (if indicated): not done  Assets:  Communication Skills Desire for Improvement Financial Resources/Insurance Housing Intimacy Physical Health Social Support  ADL's:  Intact  Cognition: WNL  Sleep:  Good   Screenings: GAD-7    Flowsheet Row Video Visit from 04/06/2021 in [place]  [facility]  Total GAD-7 Score 11      PHQ2-9    Flowsheet Row Video Visit from 04/06/2021 in [facility]  PHQ-2 Total Score 2  PHQ-9 Total Score 6      Flowsheet Row ED from 01/04/2021 in [facility] RISK CATEGORY Error: Question 6 not populated        Assessment and Plan: Patient notes that she is doing well on her current medication regimen. No medication changes made today. Patient agreeable to continue medications as prescribed.   1. Anxiety  Continue- hydrOXYzine (ATARAX/VISTARIL) 25 MG tablet; Take 1 tablet (25 mg total) by mouth 2 (two) times daily as needed for anxiety.  Dispense: 60 tablet; Refill: 3 Continue- DULoxetine (CYMBALTA) 60 MG capsule; Take 1 capsule (60 mg total) by mouth at bedtime.  Dispense: 30 capsule; Refill: 3  2. Recurrent major depressive disorder, in full remission (HCC)  Continue- DULoxetine (CYMBALTA) 60 MG capsule; Take 1 capsule (60 mg total) by mouth at bedtime.  Dispense: 30 capsule; Refill: 3   Follow up in 3 months    Shanna Cisco, NP 04/06/2021, 11:24 AM

## 2021-07-06 ENCOUNTER — Encounter (HOSPITAL_COMMUNITY): Payer: Self-pay | Admitting: Psychiatry

## 2021-07-06 ENCOUNTER — Telehealth (INDEPENDENT_AMBULATORY_CARE_PROVIDER_SITE_OTHER): Payer: No Payment, Other | Admitting: Psychiatry

## 2021-07-06 DIAGNOSIS — F419 Anxiety disorder, unspecified: Secondary | ICD-10-CM

## 2021-07-06 DIAGNOSIS — F3342 Major depressive disorder, recurrent, in full remission: Secondary | ICD-10-CM | POA: Diagnosis not present

## 2021-07-06 MED ORDER — HYDROXYZINE HCL 25 MG PO TABS: 25.0000 mg | ORAL_TABLET | Freq: Two times a day (BID) | ORAL | 3 refills | Status: DC | PRN

## 2021-07-06 MED ORDER — DULOXETINE HCL 60 MG PO CPEP: 60.0000 mg | ORAL_CAPSULE | Freq: Every day | ORAL | 3 refills | Status: DC

## 2021-07-06 NOTE — Progress Notes (Signed)
BH MD/PA/NP OP Progress Note Virtual Visit via Video Note  I connected with Judith Hansen on 04/06/21 at 11:00 AM EDT by a video enabled telemedicine application and verified that I am speaking with the correct person using two identifiers.  Location: Patient: Home Provider: Clinic   I discussed the limitations of evaluation and management by telemedicine and the availability of in person appointments. The patient expressed understanding and agreed to proceed.  I provided 30  minutes of non-face-to-face time during this encounter.   04/06/2021 11:24 AM Judith Hansen  MRN:  [id number]  Chief Complaint: "Sometimes my depression creeps up in me but its not to bad"  HPI: 30 year old female seen today for follow up psychiatric evaluation. She has a psychiatric history of depression and anxiety. She is currently managed on Hydroxyzine 25 mg twice daily and Cymbalta 60 mg nightly. She notes that her medications are effective in managing her psychiatric conditions.  Today she is pleasant, cooperative, engaged in conversation, and maintained eye contact. She notes that overall she is doing well however reports that her depression creeps up on her at times. She notes that triggers for her depression are sad events specifically when it has to do with animal. She however notes that she is able to cope wit it. She notes her anxiety is well managed. Today provider conducted a GAD 7 and patient scored a 9, at her last visit she scored an 11.  Provider also conducted a PHQ 9 and patient scored a 3, at her last visit she scored a 6. She endorses adequate sleep and appetite. Patient notes that she has been actively trying to lose weight. She notes that she recently got engaged and is planning a wedding a year from now. Today she denies SI/HI/VAH, mania, or paranoia.  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   No medication changes made today.  Patient agreeable to continue medications as prescribed. No other concerns notes at this time.  Visit Diagnosis:    ICD-10-CM   1. Anxiety  F41.9 hydrOXYzine (ATARAX/VISTARIL) 25 MG tablet    DULoxetine (CYMBALTA) 60 MG capsule    2. Recurrent major depressive disorder, in full remission (HCC)  F33.42 DULoxetine (CYMBALTA) 60 MG capsule      Past Psychiatric History: Anxiety and depression   Past Medical History:  Past Medical History:  Diagnosis Date   ADD (attention deficit disorder)    Anxiety    Asthma    Depression    Seasonal allergies     Past Surgical History:  Procedure Laterality Date   FRACTURE SURGERY  2004   rt arm has plate   ORIF FOREARM FRACTURE Right    WISDOM TOOTH EXTRACTION      Family Psychiatric History: Mother alcohol misuse and depression   Family History:  Family History  Problem Relation Age of Onset   Alcohol abuse Mother    Arthritis Mother    Depression Mother    Heart disease Maternal Grandfather     Social History:  Social History   Socioeconomic History   Marital status: Significant Other    Spouse name: Not on file   Number of children: Not on file   Years of education: Not on file   Highest education level: Not on file  Occupational History   Not on file  Tobacco Use   Smoking status: Former    Packs/day: 0.00  Types: Cigarettes    Quit date: 12/02/2015    Years since quitting: 5.3   Smokeless tobacco: Never  Vaping Use   Vaping Use: Every day  Substance and Sexual Activity   Alcohol use: Yes    Comment: occasional   Drug use: No   Sexual activity: Yes    Birth control/protection: None  Other Topics Concern   Not on file  Social History Narrative   Entered 03/2014:   Works 2nd shift at [employer] with her mom   Social Determinants of Corporate investment banker Strain: Not on [employer] Insecurity: Not on file  Transportation Needs: Not on file  Physical Activity: Not on file  Stress: Not on file   Social Connections: Not on file    Allergies: No Known Allergies  Metabolic Disorder Labs: No results found for: HGBA1C, MPG No results found for: PROLACTIN No results found for: CHOL, TRIG, HDL, CHOLHDL, VLDL, LDLCALC No results found for: TSH  Therapeutic Level Labs: No results found for: LITHIUM No results found for: VALPROATE No components found for:  CBMZ  Current Medications: Current Outpatient Medications  Medication Sig Dispense Refill   albuterol (PROVENTIL HFA;VENTOLIN HFA) 108 (90 Base) MCG/ACT inhaler Inhale 1-2 puffs into the lungs every 6 (six) hours as needed for wheezing or shortness of breath. 1 Inhaler 0   cetirizine (ZYRTEC) 10 MG tablet Take 10 mg by mouth daily.     desogestrel-ethinyl estradiol (MIRCETTE) 0.15-0.02/0.01 MG (21/5) tablet Take 1 tablet by mouth daily. 1 Package 11   DULoxetine (CYMBALTA) 60 MG capsule Take 1 capsule (60 mg total) by mouth at bedtime. 30 capsule 3   fluconazole (DIFLUCAN) 150 MG tablet Take 1 tablet (150 mg total) by mouth daily. Take one tablet now and one in 3 days if you are still having symptoms 2 tablet 0   hydrOXYzine (ATARAX/VISTARIL) 25 MG tablet Take 1 tablet (25 mg total) by mouth 2 (two) times daily as needed for anxiety. 60 tablet 3   loratadine (CLARITIN) 10 MG tablet Take 10 mg by mouth as needed for allergies.     No current facility-administered medications for this visit.     Musculoskeletal: Strength & Muscle Tone:  Unable to assess due to telehealth visit Gait & Station:  Unable to assess due to telehealth visit Patient leans: N/A  Psychiatric Specialty Exam: Review of Systems  There were no vitals taken for this visit.There is no height or weight on file to calculate BMI.  General Appearance: Well Groomed  Eye Contact:  Good  Speech:  Clear and Coherent and Normal Rate  Volume:  Normal  Mood:  Euthymic  Affect:  Appropriate and Congruent  Thought Process:  Coherent, Goal Directed, and Linear   Orientation:  Full (Time, Place, and Person)  Thought Content: WDL and Logical   Suicidal Thoughts:  No  Homicidal Thoughts:  No  Memory:  Immediate;   Good Recent;   Good Remote;   Good  Judgement:  Good  Insight:  Good  Psychomotor Activity:  Normal  Concentration:  Concentration: Good and Attention Span: Good  Recall:  Good  Fund of Knowledge: Good  Language: Good  Akathisia:  No  Handed:  Right  AIMS (if indicated): not done  Assets:  Communication Skills Desire for Improvement Financial Resources/Insurance Housing Intimacy Physical Health Social Support  ADL's:  Intact  Cognition: WNL  Sleep:  Good   Screenings: GAD-7    Haematologist  Visit from 04/06/[facility] in [facility]  Total GAD-7 Score 11      PHQ2-9    Flowsheet Row Video Visit from 04/06/2021 in [facility]  PHQ-2 Total Score 2  PHQ-9 Total Score 6      Flowsheet Row ED from 01/04/2021 in [facility] RISK CATEGORY Error: Question 6 not populated        Assessment and Plan: Patient notes that  she occasionally has depression but notes that she is able to cope with it. No medication changes made today. Patient agreeable to continue medications as prescribed.   1. Anxiety  Continue- hydrOXYzine (ATARAX/VISTARIL) 25 MG tablet; Take 1 tablet (25 mg total) by mouth 2 (two) times daily as needed for anxiety.  Dispense: 60 tablet; Refill: 3 Continue- DULoxetine (CYMBALTA) 60 MG capsule; Take 1 capsule (60 mg total) by mouth at bedtime.  Dispense: 30 capsule; Refill: 3  2. Recurrent major depressive disorder, in full remission (HCC)  Continue- DULoxetine (CYMBALTA) 60 MG capsule; Take 1 capsule (60 mg total) by mouth at bedtime.  Dispense: 30 capsule; Refill: 3   Follow up in 3 months    Shanna Cisco, NP 04/06/2021, 11:24 AM

## 2021-09-28 ENCOUNTER — Encounter (HOSPITAL_COMMUNITY): Payer: Self-pay | Admitting: Psychiatry

## 2021-09-28 ENCOUNTER — Telehealth (INDEPENDENT_AMBULATORY_CARE_PROVIDER_SITE_OTHER): Payer: No Payment, Other | Admitting: Psychiatry

## 2021-09-28 DIAGNOSIS — F3342 Major depressive disorder, recurrent, in full remission: Secondary | ICD-10-CM | POA: Diagnosis not present

## 2021-09-28 DIAGNOSIS — F419 Anxiety disorder, unspecified: Secondary | ICD-10-CM | POA: Diagnosis not present

## 2021-09-28 MED ORDER — DULOXETINE HCL 60 MG PO CPEP: 60.0000 mg | ORAL_CAPSULE | Freq: Every day | ORAL | 3 refills | Status: DC

## 2021-09-28 MED ORDER — HYDROXYZINE HCL 25 MG PO TABS: 25.0000 mg | ORAL_TABLET | Freq: Three times a day (TID) | ORAL | 3 refills | Status: DC

## 2021-09-28 NOTE — Progress Notes (Signed)
BH MD/PA/NP OP Progress Note Virtual Visit via Telephone Note  I connected with Judith Hansen on 09/28/21 at 11:00 AM EST by telephone and verified that I am speaking with the correct person using two identifiers.  Location: Patient: home Provider: Clinic   I discussed the limitations, risks, security and privacy concerns of performing an evaluation and management service by telephone and the availability of in person appointments. I also discussed with the patient that there may be a patient responsible charge related to this service. The patient expressed understanding and agreed to proceed.   I provided 30 minutes of non-face-to-face time during this encounter.   09/28/2021 9:52 AM Judith Hansen  MRN:  [id number]  Chief Complaint: "The anxiety spiked during the holiday"  HPI: 31 year old female seen today for follow up psychiatric evaluation. She has a psychiatric history of depression and anxiety. She is currently managed on Hydroxyzine 25 mg twice daily and Cymbalta 60 mg nightly. She notes that her medications are effective in managing her psychiatric conditions.  Today was unable to login virtually so assessment was done over the phone.  During exam she was pleasant, cooperative, and engaged in conversation.  She informed Clinical research associate that her anxiety spiked during the holiday and reports recently she had a panic attack.  She however notes that this is situational and reports that she finds her medications effective.  Today provider conducted a GAD-7 and patient scored a 4, at her last visit she scored a 9.  Provider also conducted PHQ-9 and patient scored a 4, at her last visit she scored a 3.  She endorses adequate sleep and appetite.  Today she denies SI/HI/VAH, mania, paranoia.    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   No medication changes made today. Patient agreeable to continue medications as prescribed. No other  concerns notes at this time.  Visit Diagnosis:    ICD-10-CM   1. Anxiety  F41.9 DULoxetine (CYMBALTA) 60 MG capsule    hydrOXYzine (ATARAX) 25 MG tablet    2. Recurrent major depressive disorder, in full remission (HCC)  F33.42 DULoxetine (CYMBALTA) 60 MG capsule      Past Psychiatric History: Anxiety and depression   Past Medical History:  Past Medical History:  Diagnosis Date   ADD (attention deficit disorder)    Anxiety    Asthma    Depression    Seasonal allergies     Past Surgical History:  Procedure Laterality Date   FRACTURE SURGERY  2004   rt arm has plate   ORIF FOREARM FRACTURE Right    WISDOM TOOTH EXTRACTION      Family Psychiatric History: Mother alcohol misuse and depression   Family History:  Family History  Problem Relation Age of Onset   Alcohol abuse Mother    Arthritis Mother    Depression Mother    Heart disease Maternal Grandfather     Social History:  Social History   Socioeconomic History   Marital status: Significant Other    Spouse name: Not on file   Number of children: Not on file   Years of education: Not on file   Highest education level: Not on file  Occupational History   Not on file  Tobacco Use   Smoking status: Former    Packs/day: 0.00    Types: Cigarettes    Quit date: 12/02/2015    Years since quitting: 5.8   Smokeless  tobacco: Never  Vaping Use   Vaping Use: Every day  Substance and Sexual Activity   Alcohol use: Yes    Comment: occasional   Drug use: No   Sexual activity: Yes    Birth control/protection: None  Other Topics Concern   Not on file  Social History Narrative   Entered 03/2014:   Works 2nd shift at [employer] with her mom   Social Determinants of Corporate investment banker Strain: Not on [employer] Insecurity: Not on file  Transportation Needs: Not on file  Physical Activity: Not on file  Stress: Not on file  Social Connections: Not on file    Allergies: No Known  Allergies  Metabolic Disorder Labs: No results found for: HGBA1C, MPG No results found for: PROLACTIN No results found for: CHOL, TRIG, HDL, CHOLHDL, VLDL, LDLCALC No results found for: TSH  Therapeutic Level Labs: No results found for: LITHIUM No results found for: VALPROATE No components found for:  CBMZ  Current Medications: Current Outpatient Medications  Medication Sig Dispense Refill   albuterol (PROVENTIL HFA;VENTOLIN HFA) 108 (90 Base) MCG/ACT inhaler Inhale 1-2 puffs into the lungs every 6 (six) hours as needed for wheezing or shortness of breath. 1 Inhaler 0   cetirizine (ZYRTEC) 10 MG tablet Take 10 mg by mouth daily.     desogestrel-ethinyl estradiol (MIRCETTE) 0.15-0.02/0.01 MG (21/5) tablet Take 1 tablet by mouth daily. 1 Package 11   DULoxetine (CYMBALTA) 60 MG capsule Take 1 capsule (60 mg total) by mouth at bedtime. 30 capsule 3   fluconazole (DIFLUCAN) 150 MG tablet Take 1 tablet (150 mg total) by mouth daily. Take one tablet now and one in 3 days if you are still having symptoms 2 tablet 0   hydrOXYzine (ATARAX) 25 MG tablet Take 1 tablet (25 mg total) by mouth 3 (three) times daily. 90 tablet 3   loratadine (CLARITIN) 10 MG tablet Take 10 mg by mouth as needed for allergies.     No current facility-administered medications for this visit.     Musculoskeletal: Strength & Muscle Tone:  Unable to assess due to telephone visit Gait & Station:  Unable to assess due to telephone visit Patient leans: N/A  Psychiatric Specialty Exam: Review of Systems  There were no vitals taken for this visit.There is no height or weight on file to calculate BMI.  General Appearance:  Unable to assess due to telephone visit  Eye Contact:   Unable to assess due to telephone visit  Speech:  Clear and Coherent and Normal Rate  Volume:  Normal  Mood:  Euthymic  Affect:  Appropriate and Congruent  Thought Process:  Coherent, Goal Directed, and Linear  Orientation:  Full (Time,  Place, and Person)  Thought Content: WDL and Logical   Suicidal Thoughts:  No  Homicidal Thoughts:  No  Memory:  Immediate;   Good Recent;   Good Remote;   Good  Judgement:  Good  Insight:  Good  Psychomotor Activity:   Unable to assess due to telephone visit  Concentration:  Concentration: Good and Attention Span: Good  Recall:  Good  Fund of Knowledge: Good  Language: Good  Akathisia:   Unable to assess due to telephone visit  Handed:  Right  AIMS (if indicated): not done  Assets:  Communication Skills Desire for Improvement Financial Resources/Insurance Housing Intimacy Physical Health Social Support  ADL's:  Intact  Cognition: WNL  Sleep:  Good   Screenings: GAD-7  [facility]  Total GAD-7 Score 4 9 11       PHQ2-9    Flowsheet Row [facility]  PHQ-2 Total Score 1 1 2   PHQ-9 Total Score 4 3 6       Flow[facility]  C-SSRS RISK CATEGORY Error: Question 6 not populated        Assessment and Plan: Patient notes that  she occasionally has anxiety but notes that she is able to cope with it. No medication changes made today. Patient agreeable to continue medications as prescribed.   1. Anxiety  Continue- hydrOXYzine (ATARAX/VISTARIL) 25 MG tablet; Take 1 tablet (25 mg total) by mouth 2 (two) times daily as needed for anxiety.  Dispense: 60 tablet; Refill: 3 Continue- DULoxetine (CYMBALTA) 60 MG capsule; Take 1 capsule (60 mg total) by mouth at bedtime.  Dispense: 30 capsule; Refill: 3  2. Recurrent  major depressive disorder, in full remission (HCC)  Continue- DULoxetine (CYMBALTA) 60 MG capsule; Take 1 capsule (60 mg total) by mouth at bedtime.  Dispense: 30 capsule; Refill: 3   Follow up in 3 months    Shanna CiscoBrittney E Jamarria Real, NP 09/28/2021, 9:52 AM

## 2021-12-04 ENCOUNTER — Telehealth (INDEPENDENT_AMBULATORY_CARE_PROVIDER_SITE_OTHER): Payer: No Payment, Other | Admitting: Psychiatry

## 2021-12-04 DIAGNOSIS — F3342 Major depressive disorder, recurrent, in full remission: Secondary | ICD-10-CM

## 2021-12-04 DIAGNOSIS — F419 Anxiety disorder, unspecified: Secondary | ICD-10-CM

## 2021-12-04 MED ORDER — DULOXETINE HCL 60 MG PO CPEP: 60.0000 mg | ORAL_CAPSULE | Freq: Every day | ORAL | 3 refills | Status: DC

## 2021-12-04 MED ORDER — HYDROXYZINE HCL 25 MG PO TABS: 25.0000 mg | ORAL_TABLET | Freq: Three times a day (TID) | ORAL | 3 refills | Status: DC

## 2021-12-04 NOTE — Progress Notes (Signed)
BH MD/PA/NP OP Progress Note ? ?12/04/2021 9:12 AM ?[name]  ?MRN:  [id number] ? ?Virtual Visit via Video Note ? ?I connected with [name] on 12/04/21 at  9:00 AM EDT by a video enabled telemedicine application and verified that I am speaking with the correct person using two identifiers. ? ?Location: ?Patient: home ?Provider: offsite ?  ?I discussed the limitations of evaluation and management by telemedicine and the availability of in person appointments. The patient expressed understanding and agreed to proceed. ? ?  ?I discussed the assessment and treatment plan with the patient. The patient was provided an opportunity to ask questions and all were answered. The patient agreed with the plan and demonstrated an understanding of the instructions. ?  ?The patient was advised to call back or seek an in-person evaluation if the symptoms worsen or if the condition fails to improve as anticipated. ? ?I provided 10 minutes of non-face-to-face time during this encounter. ? ? ?Mcneil Sober, NP  ? ?Chief Complaint: Medication management ? ?HPI: Meko Bellanger is a 31 year old female presenting to [facility] behavioral health outpatient for a follow-up psychiatric evaluation.  She has a psychiatric history of anxiety, attention deficit disorder and major depressive disorder.  Patient symptoms are managed with Cymbalta 60 mg at bedtime and hydroxyzine 25 mg 3 times daily.  Patient reports that medications are effective with managing her symptoms and that she is medication compliant.  Patient denies adverse effects or the need for dosage adjustment today.  No medication changes today. ?Patient is alert and oriented x4, calm, pleasant and willing to engage.  She reports a good mood, appetite and sleep cycle.  Patient reports a desire to lose weight due to her upcoming wedding.  No, no.  Patient denies suicidal or homicidal ideations, paranoia, delusional thought, auditory or visual hallucinations. ?Visit  Diagnosis:  ?  ICD-10-CM   ?1. Anxiety  F41.9 DULoxetine (CYMBALTA) 60 MG capsule  ?  hydrOXYzine (ATARAX) 25 MG tablet  ?  ?2. Recurrent major depressive disorder, in full remission (HCC)  F33.42 DULoxetine (CYMBALTA) 60 MG capsule  ?  ? ? ?Past Psychiatric History: Major depressive disorder, anxiety and attention deficit disorder ? ?Past Medical History:  ?Past Medical History:  ?Diagnosis Date  ? ADD (attention deficit disorder)   ? Anxiety   ? Asthma   ? Depression   ? Seasonal allergies   ?  ?Past Surgical History:  ?Procedure Laterality Date  ? FRACTURE SURGERY  2004  ? rt arm has plate  ? ORIF FOREARM FRACTURE Right   ? WISDOM TOOTH EXTRACTION    ? ? ?Family Psychiatric History: See below ? ?Family History:  ?Family History  ?Problem Relation Age of Onset  ? Alcohol abuse Mother   ? Arthritis Mother   ? Depression Mother   ? Heart disease Maternal Grandfather   ? ? ?Social History:  ?Social History  ? ?Socioeconomic History  ? Marital status: Significant Other  ?  Spouse name: Not on file  ? Number of children: Not on file  ? Years of education: Not on file  ? Highest education level: Not on file  ?Occupational History  ? Not on file  ?Tobacco Use  ? Smoking status: Former  ?  Packs/day: 0.00  ?  Types: Cigarettes  ?  Quit date: 12/02/2015  ?  Years since quitting: 6.0  ? Smokeless tobacco: Never  ?Vaping Use  ? Vaping Use: Every day  ?Substance and Sexual Activity  ? Alcohol use:  Yes  ?  Comment: occasional  ? Drug use: No  ? Sexual activity: Yes  ?  Birth control/protection: None  ?Other Topics Concern  ? Not on file  ?Social History Narrative  ? Entered 03/2014:  ? Works 2nd shift at [employer]  ? Lives with her mom  ? ?Social Determinants of Health  ? ?Financial Resource Strain: Not on file  ?Food Insecurity: Not on file  ?Transportation Needs: Not on file  ?Physical Activity: Not on file  ?Stress: Not on file  ?Social Connections: Not on file  ? ? ?Allergies: No Known Allergies ? ?Metabolic Disorder  Labs: ?No results found for: HGBA1C, MPG ?No results found for: PROLACTIN ?No results found for: CHOL, TRIG, HDL, CHOLHDL, VLDL, LDLCALC ?No results found for: TSH ? ?Therapeutic Level Labs: ?No results found for: LITHIUM ?No results found for: VALPROATE ?No components found for:  CBMZ ? ?Current Medications: ?Current Outpatient Medications  ?Medication Sig Dispense Refill  ? albuterol (PROVENTIL HFA;VENTOLIN HFA) 108 (90 Base) MCG/ACT inhaler Inhale 1-2 puffs into the lungs every 6 (six) hours as needed for wheezing or shortness of breath. 1 Inhaler 0  ? cetirizine (ZYRTEC) 10 MG tablet Take 10 mg by mouth daily.    ? desogestrel-ethinyl estradiol (MIRCETTE) 0.15-0.02/0.01 MG (21/5) tablet Take 1 tablet by mouth daily. 1 Package 11  ? DULoxetine (CYMBALTA) 60 MG capsule Take 1 capsule (60 mg total) by mouth at bedtime. 30 capsule 3  ? fluconazole (DIFLUCAN) 150 MG tablet Take 1 tablet (150 mg total) by mouth daily. Take one tablet now and one in 3 days if you are still having symptoms 2 tablet 0  ? hydrOXYzine (ATARAX) 25 MG tablet Take 1 tablet (25 mg total) by mouth 3 (three) times daily. 90 tablet 3  ? loratadine (CLARITIN) 10 MG tablet Take 10 mg by mouth as needed for allergies.    ? ?No current facility-administered medications for this visit.  ? ? ? ?Musculoskeletal: ?Strength & Muscle Tone: N/A virtual visit ?Gait & Station: N/A virtual visit ?Patient leans: N/A ? ?Psychiatric Specialty Exam: ?Review of Systems  ?Psychiatric/Behavioral:  Negative for hallucinations, self-injury and suicidal ideas. The patient is not hyperactive.   ?All other systems reviewed and are negative.  ?There were no vitals taken for this visit.There is no height or weight on file to calculate BMI.  ?General Appearance: Fairly Groomed  ?Eye Contact:  Good  ?Speech:  Clear and Coherent  ?Volume:  Normal  ?Mood:  Euthymic  ?Affect:  Congruent  ?Thought Process:  Coherent  ?Orientation:  Full (Time, Place, and Person)  ?Thought  Content: Logical   ?Suicidal Thoughts:  No  ?Homicidal Thoughts:  No  ?Memory: Good  ?Judgement:  Good  ?Insight:  Good  ?Psychomotor Activity:  NA  ?Concentration: Good  ?Recall:  Good  ?Fund of Knowledge: Good  ?Language: Good  ?Akathisia:  NA  ?Handed:  Right  ?AIMS (if indicated): not done  ?Assets:  Communication Skills ?Desire for Improvement ?Financial Resources/Insurance ?Housing ?Intimacy ?Social Support  ?ADL's:  Intact  ?Cognition: WNL  ?Sleep:  Good  ? ?Screenings: ?GAD-7   ? ?Flowsheet Row [facility]  ?Total GAD-7 Score 4 9 11   ? ?  ? ?PHQ2-9   ? ?Flowsheet Row [facility]  from 07/06/2021 in [facility] Video Visit from 04/06/2021 in [facility]  ?PHQ-2 Total Score 1 1 2   ?PHQ-9 Total Score 4 3 6   ? ?  ? ?Flowsheet Row ED [facility]2021 [facility]  ?C-SSRS RISK CATEGORY Error: Question 6 not populated  ? ?  ? ? ? ?Assessment and Plan: Milton Streicher is a 31 year old female presenting to [facility] behavioral health outpatient for a follow-up psychiatric evaluation.  She has a psychiatric history of anxiety, attention deficit disorder and major depressive disorder.  Patient symptoms are managed with Cymbalta 60 mg at bedtime and hydroxyzine 25 mg 3 times daily.  Patient reports that medications are effective with managing her symptoms and that she is medication compliant.  Patient denies adverse effects or the need for dosage adjustment today.  No medication changes today.  Medications refilled at current dosages. ? ?Collaboration of Care: Collaboration of Care: Medication Management AEB medications E scribed to patient's preferred pharmacy. ? ?1.  Anxiety ? ?- DULoxetine (CYMBALTA) 60 MG capsule; Take 1 capsule (60 mg total) by mouth at bedtime.  Dispense: 30 capsule; Refill: 3 ?- hydrOXYzine (ATARAX) 25 MG tablet; Take 1 tablet (25 mg total) by mouth 3 (three) time

## 2021-12-07 ENCOUNTER — Telehealth (HOSPITAL_COMMUNITY): Payer: No Payment, Other | Admitting: Psychiatry

## 2022-02-26 ENCOUNTER — Telehealth (HOSPITAL_COMMUNITY): Payer: No Payment, Other | Admitting: Psychiatry

## 2022-02-26 ENCOUNTER — Telehealth (INDEPENDENT_AMBULATORY_CARE_PROVIDER_SITE_OTHER): Payer: No Payment, Other | Admitting: Psychiatry

## 2022-02-26 ENCOUNTER — Encounter (HOSPITAL_COMMUNITY): Payer: Self-pay | Admitting: Psychiatry

## 2022-02-26 DIAGNOSIS — F3342 Major depressive disorder, recurrent, in full remission: Secondary | ICD-10-CM | POA: Diagnosis not present

## 2022-02-26 DIAGNOSIS — F419 Anxiety disorder, unspecified: Secondary | ICD-10-CM | POA: Diagnosis not present

## 2022-02-26 MED ORDER — HYDROXYZINE HCL 25 MG PO TABS: 25.0000 mg | ORAL_TABLET | Freq: Three times a day (TID) | ORAL | 3 refills | Status: DC

## 2022-02-26 MED ORDER — DULOXETINE HCL 60 MG PO CPEP: 60.0000 mg | ORAL_CAPSULE | Freq: Every day | ORAL | 3 refills | Status: DC

## 2022-05-30 ENCOUNTER — Encounter (HOSPITAL_COMMUNITY): Payer: Self-pay | Admitting: Psychiatry

## 2022-05-30 ENCOUNTER — Telehealth (INDEPENDENT_AMBULATORY_CARE_PROVIDER_SITE_OTHER): Payer: No Payment, Other | Admitting: Psychiatry

## 2022-05-30 DIAGNOSIS — F419 Anxiety disorder, unspecified: Secondary | ICD-10-CM | POA: Diagnosis not present

## 2022-05-30 DIAGNOSIS — F3342 Major depressive disorder, recurrent, in full remission: Secondary | ICD-10-CM

## 2022-05-30 MED ORDER — DULOXETINE HCL 60 MG PO CPEP: 60.0000 mg | ORAL_CAPSULE | Freq: Every day | ORAL | 3 refills | Status: DC

## 2022-05-30 MED ORDER — HYDROXYZINE HCL 25 MG PO TABS: 25.0000 mg | ORAL_TABLET | Freq: Three times a day (TID) | ORAL | 3 refills | Status: DC

## 2022-05-30 NOTE — Progress Notes (Signed)
BH MD/PA/NP OP Progress Note Virtual Visit via Telephone Note  I connected with Judith Hansen on 05/30/22 at 10:30 AM EDT by telephone and verified that I am speaking with the correct person using two identifiers.  Location: Patient: home Provider: Clinic   I discussed the limitations, risks, security and privacy concerns of performing an evaluation and management service by telephone and the availability of in person appointments. I also discussed with the patient that there may be a patient responsible charge related to this service. The patient expressed understanding and agreed to proceed.   I provided 30 minutes of non-face-to-face time during this encounter.    05/30/2022 10:43 AM Judith Hansen  MRN:  [id number]  Chief Complaint: "I am recovering from covid"  HPI: 31 year old female seen today for follow-up psychiatric evaluation.  She has a psychiatric history of depression, ADD, and anxiety.  She is currently managed on Cymbalta 60 mg nightly and hydroxyzine 3 times daily as needed.  She notes her medications are effective in managing her psychiatric condition.  Today she is well-groomed, pleasant, cooperative, engaged in conversation, maintained eye contact.  She informed Clinical research associate that she is recovering from Covid 19. She notes that this is a stressor as she getting married on October 7th, 2023. Despite these stressors patient informed writer that she is able to cope.  She informed Clinical research associate that her anxiety and depression are well managed.  Provider conducted a GAD-7 and patient scored a 3, at her last visit she scored an 8.  Provider also conducted PHQ-9 and patient scored a 4, at her last visit she scored a 7.  She reports that her appetite fluctuates and reports losing 15 pounds. She endorses adequate sleep. Today she denies SI/HI/VAH, mania, or paranoia.    No medication changes made today.  Patient agreeable to continue medications as prescribed.  No other concerns at this  time. Visit Diagnosis:    ICD-10-CM   1. Anxiety  F41.9 DULoxetine (CYMBALTA) 60 MG capsule    hydrOXYzine (ATARAX) 25 MG tablet    2. Recurrent major depressive disorder, in full remission (HCC)  F33.42 DULoxetine (CYMBALTA) 60 MG capsule      Past Psychiatric History: depression, ADD, and anxiety  Past Medical History:  Past Medical History:  Diagnosis Date   ADD (attention deficit disorder)    Anxiety    Asthma    Depression    Seasonal allergies     Past Surgical History:  Procedure Laterality Date   FRACTURE SURGERY  2004   rt arm has plate   ORIF FOREARM FRACTURE Right    WISDOM TOOTH EXTRACTION      Family Psychiatric History: Mother alcohol misuse and depression   Family History:  Family History  Problem Relation Age of Onset   Alcohol abuse Mother    Arthritis Mother    Depression Mother    Heart disease Maternal Grandfather     Social History:  Social History   Socioeconomic History   Marital status: Significant Other    Spouse name: Not on file   Number of children: Not on file   Years of education: Not on file   Highest education level: Not on file  Occupational History   Not on file  Tobacco Use   Smoking status: Former    Packs/day: 0.00    Types: Cigarettes    Quit date: 12/02/2015    Years since quitting: 6.4   Smokeless tobacco: Never  Vaping Use   Vaping Use: Every  day  Substance and Sexual Activity   Alcohol use: Yes    Comment: occasional   Drug use: No   Sexual activity: Yes    Birth control/protection: None  Other Topics Concern   Not on file  Social History Narrative   Entered 03/2014:   Works 2nd shift at [employer] with her mom   Social Determinants of Corporate investment banker Strain: Not on [employer] Insecurity: Not on file  Transportation Needs: Not on file  Physical Activity: Not on file  Stress: Not on file  Social Connections: Not on file    Allergies: No Known Allergies  Metabolic Disorder  Labs: No results found for: "HGBA1C", "MPG" No results found for: "PROLACTIN" No results found for: "CHOL", "TRIG", "HDL", "CHOLHDL", "VLDL", "LDLCALC" No results found for: "TSH"  Therapeutic Level Labs: No results found for: "LITHIUM" No results found for: "VALPROATE" No results found for: "CBMZ"  Current Medications: Current Outpatient Medications  Medication Sig Dispense Refill   albuterol (PROVENTIL HFA;VENTOLIN HFA) 108 (90 Base) MCG/ACT inhaler Inhale 1-2 puffs into the lungs every 6 (six) hours as needed for wheezing or shortness of breath. 1 Inhaler 0   cetirizine (ZYRTEC) 10 MG tablet Take 10 mg by mouth daily.     desogestrel-ethinyl estradiol (MIRCETTE) 0.15-0.02/0.01 MG (21/5) tablet Take 1 tablet by mouth daily. 1 Package 11   DULoxetine (CYMBALTA) 60 MG capsule Take 1 capsule (60 mg total) by mouth at bedtime. 30 capsule 3   fluconazole (DIFLUCAN) 150 MG tablet Take 1 tablet (150 mg total) by mouth daily. Take one tablet now and one in 3 days if you are still having symptoms 2 tablet 0   hydrOXYzine (ATARAX) 25 MG tablet Take 1 tablet (25 mg total) by mouth 3 (three) times daily. 90 tablet 3   loratadine (CLARITIN) 10 MG tablet Take 10 mg by mouth as needed for allergies.     No current facility-administered medications for this visit.     Musculoskeletal: Strength & Muscle Tone:  Unable to assess due to telephone visit Gait & Station:   Unable to assess due to telephone visit Patient leans: N/A  Psychiatric Specialty Exam: Review of Systems  There were no vitals taken for this visit.There is no height or weight on file to calculate BMI.  General Appearance:   Unable to assess due to telephone visit  Eye Contact:    Unable to assess due to telephone visit  Speech:  Clear and Coherent and Normal Rate  Volume:  Normal  Mood:  Euthymic  Affect:  Appropriate and Congruent  Thought Process:  Coherent, Goal Directed, and Linear  Orientation:  Full (Time, Place, and  Person)  Thought Content: WDL and Logical   Suicidal Thoughts:  No  Homicidal Thoughts:  No  Memory:  Immediate;   Good Recent;   Good Remote;   Good  Judgement:  Good  Insight:  Good  Psychomotor Activity:  Normal  Concentration:  Concentration: Good and Attention Span: Good  Recall:  Good  Fund of Knowledge: Good  Language: Good  Akathisia:  No  Handed:  Right  AIMS (if indicated): not done  Assets:  Communication Skills Desire for Improvement Financial Resources/Insurance Housing Intimacy Physical Health Social Support  ADL's:  Intact  Cognition: WNL  Sleep:  Good   Screenings: GAD-7    Flowsheet Row Video Visit from 05/30/2022 in [facility] Video Visit from 02/26/2022 in [facility] Video Visit from 09/28/2021 in [facility] Video Visit from 07/06/2021 in [facility] Video Visit from 04/06/2021 in [facility]  Total GAD-7 Score 3 8 4 9 11       PHQ2-9    Flowsheet Row Video Visit from 05/30/2022 in [facility] Video Visit from 02/26/2022 in [facility] Video Visit from 09/28/2021 in [facility] Video Visit from 07/06/2021 in [facility] Video Visit from 04/06/2021 in [place]  PHQ-2 Total Score 2 3 1 1 2   PHQ-9 Total Score 4 7 4 3 6       Flowsheet Row ED from 01/04/2021 in [facility] Error: Question 6 not populated        Assessment and Plan: Patient notes that she is doing well on her current medication regimen.  No medication changes made today.  Patient agreeable to continue medications as prescribed.  1. Anxiety  Continue- DULoxetine (CYMBALTA) 60 MG capsule; Take 1 capsule (60 mg total) by mouth at bedtime.  Dispense: 30 capsule;  Refill: 3 Continue- hydrOXYzine (ATARAX) 25 MG tablet; Take 1 tablet (25 mg total) by mouth 3 (three) times daily.  Dispense: 90 tablet; Refill: 3  2. Recurrent major depressive disorder, in full remission ([place])  Continue- DULoxetine (CYMBALTA) 60 MG capsule; Take 1 capsule (60 mg total) by mouth at bedtime.  Dispense: 30 capsule; Refill: 3    Follow-up in 3 Follow-up with therapy Salley Slaughter, NP 05/30/2022, 10:43 AM

## 2022-08-28 NOTE — Patient Instructions (Incomplete)
Thank you for attending your appointment today.  -- We did not make any medication changes today although you may try taking a 1/2 tablet (12.5 mg) Atarax as needed to reduce side effects. Please continue other medications as prescribed. -- Be on the lookout for a call from our front desk to get you scheduled for therapy.  Please do not make any changes to medications without first discussing with your provider. If you are experiencing a psychiatric emergency, please call 911 or present to your nearest emergency department. Additional crisis, medication management, and therapy resources are included below.  [facility]  [address], [place], [place] [address] [phone] WALK-IN URGENT CARE 24/7 FOR ANYONE [address], [place], [place]  [phone] Fax: [phone] [email or web address] *Interpreters available *Accepts all insurance and uninsured for Urgent Care needs *Accepts Medicaid and uninsured for outpatient treatment (below)      ONLY FOR [facility]  Below:    Outpatient New Patient Assessment/Therapy Walk-ins:        Monday -Thursday 8am until slots are full.        Every Friday 1pm-4pm  (first come, first served)                   New Patient Psychiatry/Medication Management        Monday-Friday 8am-11am (first come, first served)               For all walk-ins we ask that you arrive by 7:15am, because patients will be seen in the order of arrival.

## 2022-08-28 NOTE — Progress Notes (Unsigned)
BH MD Outpatient Progress Note  08/29/2022 12:33 PM Judith Hansen  MRN:  [id number]  Assessment:  Judith Hansen presents for follow-up evaluation. Today, 08/29/22, patient reports recurrence of depressive symptoms in the form of increased periods of depressed mood, guilt and negative self-evaluation, increased sleep, and decline in energy and motivation (although reports she continues to function well at work). She identifies that recently getting married has likely impacted unrealistic expectations she has set for herself as a new wife and is interested in establishing with therapy for increased support during this transition period and change in identity. Discussed option of further titrating Cymbalta however patient feels this dose has historically worked well for her and would like to remain at current dosing while focusing on therapy. No acute safety concerns.   Plan to RTC in 2 months; plan for coverage while this writer is on leave was discussed.   Identifying Information: Judith Hansen is a 31 y.o. female with a history of MDD and anxiety who is an established patient with [facility] participating in follow-up via video conferencing.   Plan:  # MDD, recurrent  Anxiety Past medication trials: unknown Status of problem: recurrence; moderate Interventions: -- Continue Cymbalta 60 mg nightly  -- DECREASE hydroxyzine to 12.5-25 mg TID PRN anxiety  -- Referral placed for individual psychotherapy  Patient was given contact information for behavioral health clinic and was instructed to call 911 for emergencies.   Subjective:  Chief Complaint:  Chief Complaint  Patient presents with   Medication Management    Interval History:   Chart review: Last seen by Toy Cookey, NP on 05/30/22. At that time, managed on: Cymbalta 60 mg nightly  Hydroxyzine 25 mg TID PRN anxiety  No changes were made during that visit.  Today, patient is seen at work but  reports she is in private space and able to proceed with visit. Endorses compliance with medications; using Atarax approx. 1-2 times a month. When she takes it, makes her feel "out of it" - has not tried lower dose and amenable to breaking in half.   Initially describes mood as "stable" but on further reflection feels she has been more depressed over the past few months. 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 Endorses some decrease in motivation and energy and anhedonia - not reading as frequently. States that her partner tells her she is doing a good job and encourages her but it is hard to believe him. Sleep has been good although sleeping more than she used to - typically 6-8 hours but at times upwards of 12-14 hours. Appetite is stable. Endorses occasional passive SI but denies active SI. Denies HI, AVH.   Notes that in the past when experiencing depressive episodes, would often experience similar feelings of guilt and overwhelm. Discussed option of further titratin of Cymbalta however patient prefers to remain at current dose of medication and not make any changes at this time. She feels that current symptoms are likely related to being recently married in October and feels this influences her expectations for herself; knows that her expectations can be unrealistic. Amenable to referral to therapy for increased support and would like to focus on establishing in therapy before considering changes to medication regimen.  Visit Diagnosis:    ICD-10-CM   1. MDD (major depressive disorder), recurrent episode, moderate (  HCC)  F33.1 DULoxetine (CYMBALTA) 60 MG capsule    2. Anxiety  F41.9 DULoxetine (CYMBALTA) 60 MG capsule      Past Psychiatric History:  Diagnoses: MDD, anxiety; historical diagnosis of  ADHD Hospitalizations: x2 at 58 and 31 yo for SAs (below) Suicide attempts: x2 at 36 and 31 yo via cutting wrists SIB: denies Current access to guns: believes her husband has access but she does not Substance use:   -- Etoh: socially; once every 3 months  -- Tobacco: vapes throughout the day  -- Denies use of cannabis or illicit drugs  -- Caffeine: 1 red bull per day  Past Medical History:  Past Medical History:  Diagnosis Date   ADD (attention deficit disorder)    Anxiety    Asthma    Depression    Seasonal allergies     Past Surgical History:  Procedure Laterality Date   FRACTURE SURGERY  2004   rt arm has plate   ORIF FOREARM FRACTURE Right    WISDOM TOOTH EXTRACTION     Family Psychiatric History:  Mother: alcohol misuse, depression  Family History:  Family History  Problem Relation Age of Onset   Alcohol abuse Mother    Arthritis Mother    Depression Mother    Heart disease Maternal Grandfather     Social History:  Social History   Socioeconomic History   Marital status: Significant Other    Spouse name: Not on file   Number of children: Not on file   Years of education: Not on file   Highest education level: Not on file  Occupational History   Not on file  Tobacco Use   Smoking status: Former    Packs/day: 0.00    Types: Cigarettes    Quit date: 12/02/2015    Years since quitting: 6.7   Smokeless tobacco: Never  Vaping Use   Vaping Use: Every day  Substance and Sexual Activity   Alcohol use: Yes    Comment: occasional; once every 3 months   Drug use: No   Sexual activity: Yes    Birth control/protection: None  Other Topics Concern   Not on file  Social History Narrative   Entered 03/2014:   Works 2nd shift at [employer] with her mom   Social Determinants of Radio broadcast assistant Strain: Not on [employer] Insecurity: Not on file  Transportation Needs: Not on file  Physical Activity: Not on file  Stress: Not on file   Social Connections: Not on file    Allergies: No Known Allergies  Current Medications: Current Outpatient Medications  Medication Sig Dispense Refill   hydrOXYzine (ATARAX) 25 MG tablet Take 1 tablet (25 mg total) by mouth 3 (three) times daily. 90 tablet 3   albuterol (PROVENTIL HFA;VENTOLIN HFA) 108 (90 Base) MCG/ACT inhaler Inhale 1-2 puffs into the lungs every 6 (six) hours as needed for wheezing or shortness of breath. 1 Inhaler 0   cetirizine (ZYRTEC) 10 MG tablet Take 10 mg by mouth daily.     desogestrel-ethinyl estradiol (MIRCETTE) 0.15-0.02/0.01 MG (21/5) tablet Take 1 tablet by mouth daily. 1 Package 11   DULoxetine (CYMBALTA) 60 MG capsule Take 1 capsule (60 mg total) by mouth at bedtime. 30 capsule 2   fluconazole (DIFLUCAN) 150 MG tablet Take 1 tablet (150 mg total) by mouth daily. Take one tablet now and one in 3 days if you are still having symptoms 2 tablet 0   loratadine (CLARITIN)  10 MG tablet Take 10 mg by mouth as needed for allergies.     No current facility-administered medications for this visit.    ROS: Denies any physical complaints  Objective:  Psychiatric Specialty Exam: There were no vitals taken for this visit.There is no height or weight on file to calculate BMI.  General Appearance: Casual and Fairly Groomed  Eye Contact:  Good  Speech:  Clear and Coherent and Normal Rate  Volume:  Normal  Mood:   "more depressed"  Affect:   Dysthymic; constricted  Thought Content:  Denies AVH; paranoia; IOR    Suicidal Thoughts:   Endorses occasional passive SI; denies active SI  Homicidal Thoughts:  No  Thought Process:  Goal Directed and Linear  Orientation:  Full (Time, Place, and Person)    Memory:   Grossly intact  Judgment:  Good  Insight:  Good  Concentration:  Concentration: Good  Recall:  NA  Fund of Knowledge: Good  Language: Good  Psychomotor Activity:  Normal  Akathisia:  NA  AIMS (if indicated): not done  Assets:  Communication  Skills Desire for Improvement Housing Intimacy Leisure Time Physical Health Social Support Talents/Skills Transportation Vocational/Educational  ADL's:  Intact  Cognition: WNL  Sleep:  Good although recently with periods of oversleeping   PE: General: sits comfortably in view of camera; no acute distress  Pulm: no increased work of breathing on room air  MSK: all extremity movements appear intact  Neuro: no focal neurological deficits observed  Gait & Station: unable to assess by video    Metabolic Disorder Labs: No results found for: "HGBA1C", "MPG" No results found for: "PROLACTIN" No results found for: "CHOL", "TRIG", "HDL", "CHOLHDL", "VLDL", "LDLCALC" No results found for: "TSH"  Therapeutic Level Labs: No results found for: "LITHIUM" No results found for: "VALPROATE" No results found for: "CBMZ"  Screenings:  GAD-7    Flowsheet Row Video Visit from 05/30/2022 in [facility] Video Visit from 02/26/2022 in [facility] Video Visit from 09/28/2021 in [facility] Video Visit from 07/06/2021 in [facility] Video Visit from 04/06/2021 in [facility]  Total GAD-7 Score 3 8 4 9 11       PHQ2-9    Flowsheet Row Video Visit from 05/30/2022 in [facility] Video Visit from 02/26/2022 in [facility] Video Visit from 09/28/2021 in [facility] Video Visit from 07/06/2021 in [facility] Video Visit from 04/06/2021 in [place]  PHQ-2 Total Score 2 3 1 1 2   PHQ-9 Total Score 4 7 4 3 6       Flowsheet Row ED from 01/04/2021 in [facility] [facility] Error: Question 6 not populated       Collaboration of Care: Collaboration of Care: Medication Management AEB ongoing  medication management, Psychiatrist AEB established with this provider, and Referral or follow-up with counselor/therapist AEB referral for individual psychotherapy  Patient/Guardian was advised Release of Information must be obtained prior to any record release in order to collaborate their care with an outside provider. Patient/Guardian was advised if they have not already done so to contact the registration department to sign all necessary forms in order for [place] to release information regarding their care.   Consent: Patient/Guardian gives verbal consent for treatment and assignment of benefits for services provided during this visit.  Patient/Guardian expressed understanding and agreed to proceed.   Televisit via video: I connected with patient on 08/29/22 at  9:00 AM EST by a video enabled telemedicine application and verified that I am speaking with the correct person using two identifiers.  Location: Patient: workplace in [place] Provider: clinic   I discussed the limitations of evaluation and management by telemedicine and the availability of in person appointments. The patient expressed understanding and agreed to proceed.  I discussed the assessment and treatment plan with the patient. The patient was provided an opportunity to ask questions and all were answered. The patient agreed with the plan and demonstrated an understanding of the instructions.   The patient was advised to call back or seek an in-person evaluation if the symptoms worsen or if the condition fails to improve as anticipated.  I provided 40 minutes of non-face-to-face time during this encounter.  Amanda Steuart A Lynnzie Blackson 08/29/2022, 12:33 PM

## 2022-08-29 ENCOUNTER — Encounter (HOSPITAL_COMMUNITY): Payer: Self-pay | Admitting: Psychiatry

## 2022-08-29 ENCOUNTER — Telehealth (INDEPENDENT_AMBULATORY_CARE_PROVIDER_SITE_OTHER): Payer: No Payment, Other | Admitting: Psychiatry

## 2022-08-29 DIAGNOSIS — F419 Anxiety disorder, unspecified: Secondary | ICD-10-CM | POA: Diagnosis not present

## 2022-08-29 DIAGNOSIS — F331 Major depressive disorder, recurrent, moderate: Secondary | ICD-10-CM | POA: Diagnosis not present

## 2022-08-29 MED ORDER — DULOXETINE HCL 60 MG PO CPEP: 60.0000 mg | ORAL_CAPSULE | Freq: Every evening | ORAL | 2 refills | Status: DC

## 2022-09-23 ENCOUNTER — Encounter (HOSPITAL_COMMUNITY): Payer: Self-pay

## 2022-09-23 ENCOUNTER — Ambulatory Visit (INDEPENDENT_AMBULATORY_CARE_PROVIDER_SITE_OTHER): Payer: No Payment, Other | Admitting: Mental Health

## 2022-09-23 DIAGNOSIS — F419 Anxiety disorder, unspecified: Secondary | ICD-10-CM

## 2022-09-23 DIAGNOSIS — F331 Major depressive disorder, recurrent, moderate: Secondary | ICD-10-CM
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
## 2022-10-14 ENCOUNTER — Ambulatory Visit (INDEPENDENT_AMBULATORY_CARE_PROVIDER_SITE_OTHER): Payer: No Payment, Other | Admitting: Mental Health

## 2022-10-14 DIAGNOSIS — F331 Major depressive disorder, recurrent, moderate: Secondary | ICD-10-CM | POA: Diagnosis not present

## 2022-10-14 DIAGNOSIS — F419 Anxiety disorder, unspecified: Secondary | ICD-10-CM

## 2022-10-14 NOTE — Progress Notes (Signed)
THERAPIST PROGRESS NOTE Virtual Visit via Video Note  I connected with Judith Hansen on 10/14/22 at  9:00 AM EST by a video enabled telemedicine application and verified that I am speaking with the correct person using two identifiers.  Location: Patient: work  Provider: home office    I discussed the limitations of evaluation and management by telemedicine and the availability of in person appointments. The patient expressed understanding and agreed to proceed.  I discussed the assessment and treatment plan with the patient. The patient was provided an opportunity to ask questions and all were answered. The patient agreed with the plan and demonstrated an understanding of the instructions.   The patient was advised to call back or seek an in-person evaluation if the symptoms worsen or if the condition fails to improve as anticipated.  I provided 53 minutes of non-face-to-face time during this encounter.   Marion Downer, [facility]   Session Time: 9:03am ( 53 minutes)   Participation Level: Active  Behavioral Response: CasualAlertNeutral  Type of Therapy: Individual Therapy  Treatment Goals addressed: STG: [name] increase management of moods(depression/anxiety) AEB development of x 3 effective coping skills with ability to complete x 1 task daily within the next 90 days.   ProgressTowards Goals: Initial  Interventions: CBT and Supportive  Summary: Judith Hansen is a 32 y.o. female who presents with dx of MDD and GAD. Presents alert and oriented; mood and affect adequate/WNL. Judith Hansen presents engaged and receptive to interventions. Thought process logical' goal directed. Pleasant demeanor. Judith Hansen shares for improvements with navigating feelings of overwhelm. Able to process with therapist ability to gain support from husband with hx of thoughts of having to do it all independently. Shares difficulty asking for help. Explored with therapist setting schedule and routine to  support in setting realistic expectations of self. Able to explore with therapist ability to advocate for self at work and communicating with boss needs. Shares history of anxiety and panic attacks and learning signs and sxs of attack presenting and learning ways to cope. Agrees to explore scheduling and routine and setting realistic goals for self to increase management of anxiety. Shares plan to work to identify one daily tasks to complete. Denies safety concerns. Improvement in sxs of anxiety and depression and working to make progress with goals. No safety concerns noted.   Suicidal/Homicidal: Nowithout intent/plan  Therapist Response: Therapist engaged Judith Hansen in [place] session. Completed check in and reviewed bounds of confidentiality and informed consent. Provided safe space for Judith Hansen to share thoughts and feelings related to anxiety and hx of mood concerns. Explored with Judith Hansen ability to set realistic expectations for self and learning to advocate for her needs. Engaged in guided discovery to explore ability to put in place greater balance and self-care and addressing distorted belief of having to do everything herself. Assessed for natural supports and socialization and engagement in self-care. Reviewed presence and levels of anxiety present and use of coping skills and healthy habits to support in greater management of anxiety. Reviewed session and provided follow up appointment. Assessed for safety.   Plan: Return again in  x 4 weeks.  Diagnosis: MDD (major depressive disorder), recurrent episode, moderate ([place])  Anxiety  Collaboration of Care: Other None  Patient/Guardian was advised Release of Information must be obtained prior to any record release in order to collaborate their care with an outside provider. Patient/Guardian was advised if they have not already done so to contact the registration department to sign all necessary forms in order  for [place] to release information  regarding their care.   Consent: Patient/Guardian gives verbal consent for treatment and assignment of benefits for services provided during this visit. Patient/Guardian expressed understanding and agreed to proceed.   Judith Hansen [place], [facility] 10/14/2022

## 2022-10-30 ENCOUNTER — Telehealth (INDEPENDENT_AMBULATORY_CARE_PROVIDER_SITE_OTHER): Payer: No Payment, Other | Admitting: Physician Assistant

## 2022-10-30 DIAGNOSIS — F331 Major depressive disorder, recurrent, moderate: Secondary | ICD-10-CM

## 2022-10-30 DIAGNOSIS — F419 Anxiety disorder, unspecified: Secondary | ICD-10-CM

## 2022-10-30 NOTE — Progress Notes (Unsigned)
BH MD/PA/NP OP Progress Note  Virtual Visit via Video Note  I connected with Judith Hansen on 10/31/22 at  3:30 PM EST by a video enabled telemedicine application and verified that I am speaking with the correct person using two identifiers.  Location: Patient: Home Provider: Clinic   I discussed the limitations of evaluation and management by telemedicine and the availability of in person appointments. The patient expressed understanding and agreed to proceed.  Follow Up Instructions:   I discussed the assessment and treatment plan with the patient. The patient was provided an opportunity to ask questions and all were answered. The patient agreed with the plan and demonstrated an understanding of the instructions.   The patient was advised to call back or seek an in-person evaluation if the symptoms worsen or if the condition fails to improve as anticipated.  I provided 23 minutes of non-face-to-face time during this encounter.   Malachy Mood, PA   10/31/2022 5:06 AM Kianah Laabs  MRN:  [id number]  Chief Complaint:  Chief Complaint  Patient presents with   Follow-up   Medication Refill   HPI:   Juliyana Nebel is a 32 year old Caucasian female with a past psychiatric history significant for anxiety and major depressive disorder who presents to [facility] via virtual video visit for follow-up and medication management.  Patient was last seen by Alda Berthold, MD on 08/29/2022.  During her last encounter, patient was being managed on the following psychiatric medications:  Cymbalta 60 mg at bedtime Hydroxyzine 25 mg 3 times daily as needed  Patient reports that her depression and anxiety are manageable at this time but reports that her medications do not take away her symptoms completely.  Patient endorses depression and rates her depression a 5 out of 10 with 10 being most severe.  Patient states that her depressive symptoms are  not common and states that she may experience a depressive episode for a week every 3 months.  Whenever she is experiencing a depressive episode, patient endorses worthlessness as a main symptom that she experiences.  Patient endorses anxiety and rates her anxiety at 3 out of 10.  She states that her anxiety is minimal the majority of the time and states that it has waned off since the introduction of her medications.  Patient states that she used to have severe panic attacks when she was on her contraceptive.  She states that she has taken herself off her birth control and has experienced a decrease in frequency of her panic attacks.  Patient's stressors include the following: going to work, income, and bills.  A PHQ-9 screen was performed with the patient scoring an 8.  A GAD-7 screen was also performed with the patient scored a 10.  Patient is alert and oriented, calm, cooperative, and fully engaged in conversation during the encounter.  Patient endorses fine and happy mood.  Patient denies suicidal or homicidal ideations.  She further denies auditory or visual hallucinations and does not appear to be responding to internal/external stimuli.  Patient endorses good sleep and receives on average 8 to 9 hours of sleep each night.  Patient endorses fair appetite and eats on average 1 meal per day.  Patient denies alcohol consumption and illicit drug use.  Patient denies tobacco use but does engage in vaping.  Visit Diagnosis:    ICD-10-CM   1. Anxiety  F41.9 DULoxetine (CYMBALTA) 60 MG capsule    hydrOXYzine (ATARAX) 25 MG tablet  2. MDD (major depressive disorder), recurrent episode, moderate (HCC)  F33.1 DULoxetine (CYMBALTA) 60 MG capsule      Past Psychiatric History:  Diagnoses: MDD, anxiety; historical diagnosis of ADHD Hospitalizations: x2 at 32 and 31 yo for SAs (below) Suicide attempts: x2 at 32 and 32 yo via cutting wrists SIB: denies Current access to guns: believes her husband has  access but she does not Substance use:              -- Etoh: socially; once every 3 months             -- Tobacco: vapes throughout the day             -- Denies use of cannabis or illicit drugs             -- Caffeine: 1 red bull per day  Past Medical History:  Past Medical History:  Diagnosis Date   ADD (attention deficit disorder)    Anxiety    Asthma    Depression    Seasonal allergies     Past Surgical History:  Procedure Laterality Date   FRACTURE SURGERY  2004   rt arm has plate   ORIF FOREARM FRACTURE Right    WISDOM TOOTH EXTRACTION      Family Psychiatric History:  Mother: alcohol misuse, depression   Family History:  Family History  Problem Relation Age of Onset   Alcohol abuse Mother    Arthritis Mother    Depression Mother    Heart disease Maternal Grandfather     Social History:  Social History   Socioeconomic History   Marital status: Significant Other    Spouse name: Not on file   Number of children: Not on file   Years of education: Not on file   Highest education level: Not on file  Occupational History   Not on file  Tobacco Use   Smoking status: Former    Packs/day: 0.00    Types: Cigarettes    Quit date: 12/02/2015    Years since quitting: 6.9   Smokeless tobacco: Never  Vaping Use   Vaping Use: Every day  Substance and Sexual Activity   Alcohol use: Yes    Comment: occasional; once every 3 months   Drug use: No   Sexual activity: Yes    Birth control/protection: None  Other Topics Concern   Not on file  Social History Narrative   Entered 03/2014:   Works 2nd shift at [employer] with her mom   Social Determinants of Health   Financial Resource Strain: Medium Risk (09/23/2022)   Overall Financial Resource Strain (CARDIA)    Difficulty of Paying Living Expenses: Somewhat hard  Food Insecurity: No Food Insecurity (09/23/2022)   Hunger Vital Sign    Worried About Running Out of Food in the Last Year: Never true    Jay in the Last Year: Never true  Transportation Needs: No Transportation Needs (09/23/2022)   PRAPARE - Hydrologist (Medical): No    Lack of Transportation (Non-Medical): No  Physical Activity: Inactive (09/23/2022)   Exercise Vital Sign    Days of Exercise per Week: 0 days    Minutes of Exercise per Session: 0 min  Stress: Stress Concern Present (09/23/2022)   [place]    Feeling of Stress : To some extent  Social Connections: Moderately Isolated (09/23/2022)  Social Licensed conveyancer [NHANES]    Frequency of Communication with Friends and Family: More than three times a week    Frequency of Social Gatherings with Friends and Family: Never    Attends Religious Services: Never    Printmaker: No    Attends Music therapist: Never    Marital Status: Married    Allergies: No Known Allergies  Metabolic Disorder Labs: No results found for: "HGBA1C", "MPG" No results found for: "PROLACTIN" No results found for: "CHOL", "TRIG", "HDL", "CHOLHDL", "VLDL", "LDLCALC" No results found for: "TSH"  Therapeutic Level Labs: No results found for: "LITHIUM" No results found for: "VALPROATE" No results found for: "CBMZ"  Current Medications: Current Outpatient Medications  Medication Sig Dispense Refill   albuterol (PROVENTIL HFA;VENTOLIN HFA) 108 (90 Base) MCG/ACT inhaler Inhale 1-2 puffs into the lungs every 6 (six) hours as needed for wheezing or shortness of breath. 1 Inhaler 0   cetirizine (ZYRTEC) 10 MG tablet Take 10 mg by mouth daily.     desogestrel-ethinyl estradiol (MIRCETTE) 0.15-0.02/0.01 MG (21/5) tablet Take 1 tablet by mouth daily. 1 Package 11   DULoxetine (CYMBALTA) 60 MG capsule Take 1 capsule (60 mg total) by mouth at bedtime. 30 capsule 2   fluconazole (DIFLUCAN) 150 MG tablet Take 1 tablet (150 mg total) by mouth  daily. Take one tablet now and one in 3 days if you are still having symptoms 2 tablet 0   hydrOXYzine (ATARAX) 25 MG tablet Take 1 tablet (25 mg total) by mouth 3 (three) times daily. 90 tablet 2   loratadine (CLARITIN) 10 MG tablet Take 10 mg by mouth as needed for allergies.     No current facility-administered medications for this visit.     Musculoskeletal: Strength & Muscle Tone: within normal limits Gait & Station: normal Patient leans: N/A  Psychiatric Specialty Exam: Review of Systems  Psychiatric/Behavioral:  Negative for decreased concentration, dysphoric mood, hallucinations, self-injury, sleep disturbance and suicidal ideas. The patient is nervous/anxious. The patient is not hyperactive.     There were no vitals taken for this visit.There is no height or weight on file to calculate BMI.  General Appearance: Casual  Eye Contact:  Good  Speech:  Clear and Coherent and Normal Rate  Volume:  Normal  Mood:  Anxious and Depressed  Affect:  Appropriate  Thought Process:  Coherent and Descriptions of Associations: Intact  Orientation:  Full (Time, Place, and Person)  Thought Content: WDL   Suicidal Thoughts:  No  Homicidal Thoughts:  No  Memory:  Immediate;   Good Recent;   Good Remote;   Good  Judgement:  Good  Insight:  Good  Psychomotor Activity:  Normal  Concentration:  Concentration: Good and Attention Span: Good  Recall:  Good  Fund of Knowledge: Good  Language: Good  Akathisia:  No  Handed:  N/A  AIMS (if indicated): not done  Assets:  Communication Skills Desire for [place] Talents/Skills Transportation Vocational/Educational  ADL's:  Intact  Cognition: WNL  Sleep:  Good   Screenings: AUDIT    Health and safety inspector from 09/23/2022 in [facility]  Alcohol Use Disorder Identification Test Final Score (AUDIT) 3      GAD-7    Flowsheet Row Video  Visit from 10/30/2022 in [facility] Counselor from 09/23/2022 in [facility] Video Visit from 05/30/2022 in [facility] Video Visit from 02/26/2022 in [facility] Video Visit from 09/28/2021 in [facility]  Total GAD-7 Score '10 8 3 8 4      '$ PHQ2-9    Flowsheet Row Video Visit from 10/30/2022 in [facility] Counselor from 09/23/2022 in [facility] Video Visit from 05/30/2022 in [facility] Video Visit from 02/26/2022 in [facility] Video Visit from 09/28/2021 in [place]  PHQ-2 Total Score '3 2 2 3 1  '$ PHQ-9 Total Score '8 7 4 7 4      '$ Flowsheet Row Video Visit from 10/30/2022 in [facility] Counselor from 09/23/2022 in [facility] ED from 01/04/2021 in [facility] Error: Question 6 not populated        Assessment and Plan:   Machel Leedham is a 32 year old Caucasian female with a past psychiatric history significant for anxiety and major depressive disorder who presents to [facility] via virtual video visit for follow-up and medication management.  Although patient continues to endorse depression and anxiety, she states that her symptoms have decreased since the introduction of her psychiatric medications.  Patient states that her depressive symptoms are not common but states that it is not uncommon to be depressed for a short period of time.  Patient states that her anxiety has been been minimal as of late.  Patient denies any issues or concerns regarding her current medication regimen and would like to continue taking her medications as prescribed.  Patient's  medications to be prescribed to pharmacy of choice.  Collaboration of Care: Collaboration of Care: Medication Management AEB provider managing patient's psychiatric medications and Psychiatrist AEB patient being seen by mental health provider at this facility  Patient/Guardian was advised Release of Information must be obtained prior to any record release in order to collaborate their care with an outside provider. Patient/Guardian was advised if they have not already done so to contact the registration department to sign all necessary forms in order for [place] to release information regarding their care.   Consent: Patient/Guardian gives verbal consent for treatment and assignment of benefits for services provided during this visit. Patient/Guardian expressed understanding and agreed to proceed.   1. Anxiety  - DULoxetine (CYMBALTA) 60 MG capsule; Take 1 capsule (60 mg total) by mouth at bedtime.  Dispense: 30 capsule; Refill: 2 - hydrOXYzine (ATARAX) 25 MG tablet; Take 1 tablet (25 mg total) by mouth 3 (three) times daily.  Dispense: 90 tablet; Refill: 2  2. MDD (major depressive disorder), recurrent episode, moderate (HCC)  - DULoxetine (CYMBALTA) 60 MG capsule; Take 1 capsule (60 mg total) by mouth at bedtime.  Dispense: 30 capsule; Refill: 2  Patient to follow up in 2 months Provider spent a total of 23 minutes with the patient/reviewing patient's chart  Malachy Mood, PA 10/31/2022, 5:06 AM

## 2022-10-31 ENCOUNTER — Encounter (HOSPITAL_COMMUNITY): Payer: Self-pay | Admitting: Physician Assistant

## 2022-10-31 MED ORDER — HYDROXYZINE HCL 25 MG PO TABS: 25.0000 mg | ORAL_TABLET | Freq: Three times a day (TID) | ORAL | 2 refills | Status: DC

## 2022-10-31 MED ORDER — DULOXETINE HCL 60 MG PO CPEP: 60.0000 mg | ORAL_CAPSULE | Freq: Every evening | ORAL | 2 refills | Status: DC

## 2022-11-08 ENCOUNTER — Ambulatory Visit (INDEPENDENT_AMBULATORY_CARE_PROVIDER_SITE_OTHER): Payer: No Payment, Other | Admitting: Mental Health

## 2022-11-08 DIAGNOSIS — F331 Major depressive disorder, recurrent, moderate: Secondary | ICD-10-CM | POA: Diagnosis not present

## 2022-11-08 DIAGNOSIS — F419 Anxiety disorder, unspecified: Secondary | ICD-10-CM

## 2022-11-08 NOTE — Progress Notes (Signed)
THERAPIST PROGRESS NOTE Virtual Visit via Video Note  I connected with Judith Hansen on 11/08/22 at  9:00 AM EST by a video enabled telemedicine application and verified that I am speaking with the correct person using two identifiers.  Location: Patient: work Provider: office    I discussed the limitations of evaluation and management by telemedicine and the availability of in person appointments. The patient expressed understanding and agreed to proceed.  I discussed the assessment and treatment plan with the patient. The patient was provided an opportunity to ask questions and all were answered. The patient agreed with the plan and demonstrated an understanding of the instructions.   The patient was advised to call back or seek an in-person evaluation if the symptoms worsen or if the condition fails to improve as anticipated.  I provided 55 minutes of non-face-to-face time during this encounter.   Judith Hansen, [facility]   Session Time: 9:05am (51 minutes)  Participation Level: Active  Behavioral Response: CasualAlertWNL  Type of Therapy: Individual Therapy  Treatment Goals addressed: STG: Judith Hansen increase management of moods(depression/anxiety) AEB development of x 3 effective coping skills with ability to complete x 1 task daily within the next 90 days.   ProgressTowards Goals: Progressing  Interventions: CBT and Supportive  Summary:   Judith Hansen is a 32 y.o. female who presents with dx of MDD and GAD. Presents alert and oriented; mood and affect anxious/stress; low. Judith Hansen presents engaged and receptive to interventions. Thought process logical' goal directed. Neutral demeanor. Judith Hansen shares concerns for increased stress and notes to have a day of in x 10 days straight. Shares for high degree of call outs in which she presents to work. Notes for car to have also broke down and in need of a new vehicle. Shares feelings of being overwhelmed and neglecting her  husband, house and animals. Able to process with therapist in working to instill better balance and working to advocate for self and engaging in self care. Shares difficulty accessing desire to work for finances and taking time to rest. Shares thoughts of applying to additional work however thoughts of not being able to obtain with others more qualified. Agrees to explore working to obtain additional positions and communicating needs and saying no. Progress with goals noted; increase in sxs of anxiety and depression. Denies safety concerns.   Suicidal/Homicidal: Nowithout intent/plan  Therapist Response: Therapist engaged [place] in [place] session.Provided safe space for Judith Hansen to share thoughts and feelings related to anxiety and feelings of depression. Assessed for current triggering events and provided supportive feedback. Validated feelings. Engaged in guided discovery to explore ability to reframe thinking for ability to put self vs. Others. Provided education on ability to be assertive and assertiveness to not be aggressiveness or being selfish. Educated on ability to assess mental and emotional capability to take on additional days of work while balancing need for finances. Encouraged focusing on one tasks at a time. Supported in reframing thoughts related to securing additional employment and encouraged confidence in self. Encouraged engagement in self-care and self-soothing coping skills. Reviewed session and provided follow up appointment. No safety concerns noted.    Plan: Return again in  x 4 weeks.  Diagnosis: MDD (major depressive disorder), recurrent episode, moderate ([place])  Anxiety  Collaboration of Care: Other None  Patient/Guardian was advised Release of Information must be obtained prior to any record release in order to collaborate their care with an outside provider. Patient/Guardian was advised if they have not already done  so to contact the registration department to sign  all necessary forms in order for [place] to release information regarding their care.   Consent: Patient/Guardian gives verbal consent for treatment and assignment of benefits for services provided during this visit. Patient/Guardian expressed understanding and agreed to proceed.   Judith Hansen Judith Hansen, [facility] 11/08/2022

## 2022-12-06 ENCOUNTER — Ambulatory Visit (INDEPENDENT_AMBULATORY_CARE_PROVIDER_SITE_OTHER): Payer: No Payment, Other | Admitting: Mental Health

## 2022-12-06 DIAGNOSIS — F331 Major depressive disorder, recurrent, moderate: Secondary | ICD-10-CM | POA: Diagnosis not present

## 2022-12-06 DIAGNOSIS — F419 Anxiety disorder, unspecified: Secondary | ICD-10-CM

## 2022-12-06 NOTE — Progress Notes (Signed)
   THERAPIST PROGRESS NOTE Virtual Visit via Video Note  I connected with Judith Hansen on 12/06/22 at  9:00 AM EDT by a video enabled telemedicine application and verified that I am speaking with the correct person using two identifiers.  Location: Patient: home address on file Provider: office   I discussed the limitations of evaluation and management by telemedicine and the availability of in person appointments. The patient expressed understanding and agreed to proceed.   I discussed the assessment and treatment plan with the patient. The patient was provided an opportunity to ask questions and all were answered. The patient agreed with the plan and demonstrated an understanding of the instructions.   The patient was advised to call back or seek an in-person evaluation if the symptoms worsen or if the condition fails to improve as anticipated.  I provided 53 minutes of non-face-to-face time during this encounter.   Dorris Singh, [facility]   Session Time: 9:06 am ( 53 minutes)  Participation Level: Active  Behavioral Response: Fairly GroomedAlertAdequate  Type of Therapy: Individual Therapy  Treatment Goals addressed: STG: [place] increase management of moods(depression/anxiety) AEB development of x 3 effective coping skills with ability to complete x 1 task daily within the next 90 days.   ProgressTowards Goals: Progressing  Interventions: CBT and Supportive  Summary: Judith Hansen is a 32 y.o. female who presents with dx of MDD and GAD. Presents alert and oriented; mood and affect anxious/stress; low. Judith Hansen presents engaged and receptive to interventions. Thought process logical' goal directed. Neutral demeanor. Judith Hansen shares    Suicidal/Homicidal: Nowith intent/plan  Therapist Response: Therapist engaged [place] in [employer].Provided safe space for [place] to share thoughts and feelings related to anxiety and feelings of depression. Assessed for  current triggering events and provided supportive feedback. Validated feelings. Engaged in guided   Plan: Return again in  x 4 weeks.  Diagnosis: MDD (major depressive disorder), recurrent episode, moderate  Anxiety  Collaboration of Care: Other None  Patient/Guardian was advised Release of Information must be obtained prior to any record release in order to collaborate their care with an outside provider. Patient/Guardian was advised if they have not already done so to contact the registration department to sign all necessary forms in order for [place] to release information regarding their care.   Consent: Patient/Guardian gives verbal consent for treatment and assignment of benefits for services provided during this visit. Patient/Guardian expressed understanding and agreed to proceed.   Stephan Minister [facility], [facility] 12/06/2022

## 2022-12-29 NOTE — Progress Notes (Unsigned)
BH MD Outpatient Progress Note  12/31/2022 9:28 AM Judith Hansen  MRN:  [id number]  Assessment:  Judith Hansen presents for follow-up evaluation. Today, 12/31/22, patient reports significant stress related to various situational factors leading to irritability and frustration. Despite this, she reports stability of depressive symptoms and feels that Cymbalta is working well to maintain mood stability. No acute safety concerns. Discussed medication options to further target anxiety and irritability however patient opted to maintain current dosing as she denies dysfunction from symptoms at this time and feels she is successfully able to use coping skills to manage these symptoms. Given report of worsening irritability in luteal phase of menses, provided education regarding role of OCPs to target hormonal contributions to mood changes; she reports negative response to OCP in the past but may consider discussing with PCP if symptoms persist/worsen.   Plan to RTC in 3 months by video.  Identifying Information: Judith Hansen is a 32 y.o. female with a history of MDD and anxiety who is an established patient with [facility] participating in follow-up via video conferencing.   Plan:  # MDD, recurrent  Anxiety Past medication trials: unknown Status of problem: stable Interventions: -- Continue Cymbalta 60 mg nightly  -- Continue hydroxyzine 12.5-25 mg TID PRN anxiety  -- Continue individual psychotherapy with Stephan Minister, [facility]  Patient was given contact information for behavioral health clinic and was instructed to call 911 for emergencies.   Subjective:  Chief Complaint:  Chief Complaint  Patient presents with   Medication Management    Interval History:   Chart review: Last seen by Otila Back, PA on 10/30/22. At that time, managed on: Cymbalta 60 mg nightly  Hydroxyzine 12-25 mg TID PRN anxiety  Reporting benefit from medications although with some ongoing  sx of depression and anxiety; no changes were made during that visit.  Today, patient reports life has been hard however mood has remained fairly even. Anxiety can be "through the roof" some mornings related to family stressors (stepfather in hospital; financial stress). Anxiety does come back down when not facing acute stress. Denies that anxiety is leading to dysfunction. May feel "done" and read a book or take a nap when feeling overwhelmed - worries this may be happening more and more; self isolating more; feeling fairly irritable but feels this is normal response to situational stressors and not leading to dysfunction. Identifies that irritability and anxiety worsen week prior to menses starting. Stopped taking birth control about 3 years ago and feels that OCP worsened panic attacks. Provided education about variety of OCPs that may help with hormonal contributions to mood fluctuations and she will consider discussing with PCP if persists.  Not using hydroxyzine as it knocks her out; has not tried 1/2 tablet and amenable to trying this. Sleeping well with about 6-7 hours nightly. Denies anxiety interfering with sleep.   Endorses passive SI but denies active SI. Denies HI.   Discussed option of further titration of Cymbalta vs. Augmentation however patient opted to maintain medications at current doses. She feels that Cymbalta is working well to control depressive symptoms and that irritability/anxiety has been fairly persistent throughout her life and she has learned strategies of managing these symptoms. Reviewed signs/sx that may indicate need for medication changes.  Visit Diagnosis:    ICD-10-CM   1. MDD (major depressive disorder), recurrent episode, moderate (HCC)  F33.1 DULoxetine (CYMBALTA) 60 MG capsule    2. Anxiety  F41.9 DULoxetine (CYMBALTA) 60 MG capsule  Past Psychiatric History:  Diagnoses: MDD, anxiety; historical diagnosis of ADHD Hospitalizations: x2 at 34 and 32 yo for  SAs (below) Suicide attempts: x2 at 52 and 32 yo via cutting wrists SIB: denies Current access to guns: believes her husband has access but she does not Substance use:   -- Etoh: socially; once every 3 months  -- Tobacco: vapes throughout the day  -- Denies use of cannabis or illicit drugs  -- Caffeine: 1 red bull per day  Past Medical History:  Past Medical History:  Diagnosis Date   ADD (attention deficit disorder)    Anxiety    Asthma    Depression    Seasonal allergies     Past Surgical History:  Procedure Laterality Date   FRACTURE SURGERY  2004   rt arm has plate   ORIF FOREARM FRACTURE Right    WISDOM TOOTH EXTRACTION     Family Psychiatric History:  Mother: alcohol misuse, depression  Family History:  Family History  Problem Relation Age of Onset   Alcohol abuse Mother    Arthritis Mother    Depression Mother    Heart disease Maternal Grandfather     Social History:  Social History   Socioeconomic History   Marital status: Significant Other    Spouse name: Not on file   Number of children: Not on file   Years of education: Not on file   Highest education level: Not on file  Occupational History   Not on file  Tobacco Use   Smoking status: Former    Packs/day: 0    Types: Cigarettes    Quit date: 12/02/2015    Years since quitting: 7.0   Smokeless tobacco: Never  Vaping Use   Vaping Use: Every day  Substance and Sexual Activity   Alcohol use: Yes    Comment: occasional; once every 3 months   Drug use: No   Sexual activity: Yes    Birth control/protection: None  Other Topics Concern   Not on file  Social History Narrative   Entered 03/2014:   Works 2nd shift at [employer] with her mom   Social Determinants of Health   Financial Resource Strain: Medium Risk (09/23/2022)   Overall Financial Resource Strain (CARDIA)    Difficulty of Paying Living Expenses: Somewhat hard  Food Insecurity: No Food Insecurity (09/23/2022)   Hunger Vital  Sign    Worried About Running Out of Food in the Last Year: Never true    Ran Out of Food in the Last Year: Never true  Transportation Needs: No Transportation Needs (09/23/2022)   PRAPARE - Administrator, Civil Service (Medical): No    Lack of Transportation (Non-Medical): No  Physical Activity: Inactive (09/23/2022)   Exercise Vital Sign    Days of Exercise per Week: 0 days    Minutes of Exercise per Session: 0 min  Stress: Stress Concern Present (09/23/2022)   [employer] of Occupational Health - Occupational Stress Questionnaire    Feeling of Stress : To some extent  Social Connections: Moderately Isolated (09/23/2022)   Social Connection and Isolation Panel [NHANES]    Frequency of Communication with Friends and Family: More than three times a week    Frequency of Social Gatherings with Friends and Family: Never    Attends Religious Services: Never    Database administrator or Organizations: No    Attends Banker Meetings: Never    Marital Status: Married  Allergies: No Known Allergies  Current Medications: Current Outpatient Medications  Medication Sig Dispense Refill   albuterol (PROVENTIL HFA;VENTOLIN HFA) 108 (90 Base) MCG/ACT inhaler Inhale 1-2 puffs into the lungs every 6 (six) hours as needed for wheezing or shortness of breath. 1 Inhaler 0   cetirizine (ZYRTEC) 10 MG tablet Take 10 mg by mouth daily.     DULoxetine (CYMBALTA) 60 MG capsule Take 1 capsule (60 mg total) by mouth at bedtime. 30 capsule 3   fluconazole (DIFLUCAN) 150 MG tablet Take 1 tablet (150 mg total) by mouth daily. Take one tablet now and one in 3 days if you are still having symptoms 2 tablet 0   hydrOXYzine (ATARAX) 25 MG tablet Take 1 tablet (25 mg total) by mouth 3 (three) times daily. (Patient not taking: Reported on 12/31/2022) 90 tablet 2   loratadine (CLARITIN) 10 MG tablet Take 10 mg by mouth as needed for allergies.     No current facility-administered  medications for this visit.    ROS: Denies any physical complaints  Objective:  Psychiatric Specialty Exam: There were no vitals taken for this visit.There is no height or weight on file to calculate BMI.  General Appearance: Casual and Fairly Groomed  Eye Contact:  Good  Speech:  Clear and Coherent and Normal Rate  Volume:  Normal  Mood:   "stressed"  Affect:   Frustrated however calm  Thought Content:  Denies AVH; paranoia; IOR    Suicidal Thoughts:   Endorses intermittent passive SI; denies active SI  Homicidal Thoughts:  No  Thought Process:  Goal Directed and Linear  Orientation:  Full (Time, Place, and Person)    Memory:   Grossly intact  Judgment:  Good  Insight:  Good  Concentration:  Concentration: Good  Recall:  NA  Fund of Knowledge: Good  Language: Good  Psychomotor Activity:  Normal  Akathisia:  NA  AIMS (if indicated): not done  Assets:  Communication Skills Desire for Improvement Housing Intimacy Leisure Time Physical Health Social Support Talents/Skills Transportation Vocational/Educational  ADL's:  Intact  Cognition: WNL  Sleep:  Good   PE: General: sits comfortably in view of camera; no acute distress  Pulm: no increased work of breathing on room air  MSK: all extremity movements appear intact  Neuro: no focal neurological deficits observed  Gait & Station: unable to assess by video    Metabolic Disorder Labs: No results found for: "HGBA1C", "MPG" No results found for: "PROLACTIN" No results found for: "CHOL", "TRIG", "HDL", "CHOLHDL", "VLDL", "LDLCALC" No results found for: "TSH"  Therapeutic Level Labs: No results found for: "LITHIUM" No results found for: "VALPROATE" No results found for: "CBMZ"  Screenings:  AUDIT    Flowsheet Row Counselor from 09/23/2022 in [facility]  Alcohol Use Disorder Identification Test Final Score (AUDIT) 3      GAD-7    Flowsheet Row Video Visit from 10/30/2022 in  [facility] Counselor from 09/23/2022 in [facility] Video Visit from 05/30/2022 in [facility] Video Visit from 02/26/2022 in [facility] Video Visit from 09/28/2021 in [facility]  Total GAD-7 Score 10 8 3 8 4       PHQ2-9    Flowsheet Row Video Visit from 10/30/2022 in [facility] Counselor from 09/23/2022 in [facility] Video Visit from 05/30/2022 in [facility] Video Visit from  02/26/2022 in [facility] Video Visit from 09/28/2021 in [facility]  PHQ-2 Total Score 3 2 2 3 1   PHQ-9 Total Score 8 7 4 7 4       Flowsheet Row Video Visit from 10/30/2022 in [facility] Counselor from 09/23/2022 in [facility] ED from 01/04/2021 in [facility] at [facility] RISK CATEGORY Low Risk Low Risk Error: Question 6 not populated       Collaboration of Care: Collaboration of Care: Medication Management AEB ongoing medication management, Psychiatrist AEB established with this provider, and Referral or follow-up with counselor/therapist AEB referral for individual psychotherapy  Patient/Guardian was advised Release of Information must be obtained prior to any record release in order to collaborate their care with an outside provider. Patient/Guardian was advised if they have not already done so to contact the registration department to sign all necessary forms in order for [place] to release information regarding their care.   Consent: Patient/Guardian gives verbal consent for treatment and assignment of benefits for services provided during this visit. Patient/Guardian expressed understanding and agreed to proceed.   Televisit via video: I connected with  patient on 12/31/22 at  9:00 AM EDT by a video enabled telemedicine application and verified that I am speaking with the correct person using two identifiers.  Location: Patient: workplace in [place] Provider: remote office in [place]   I discussed the limitations of evaluation and management by telemedicine and the availability of in person appointments. The patient expressed understanding and agreed to proceed.  I discussed the assessment and treatment plan with the patient. The patient was provided an opportunity to ask questions and all were answered. The patient agreed with the plan and demonstrated an understanding of the instructions.   The patient was advised to call back or seek an in-person evaluation if the symptoms worsen or if the condition fails to improve as anticipated.  I provided 25 minutes of non-face-to-face time during this encounter.  Milburn Freeney A Vondra Aldredge 12/31/2022, 9:28 AM

## 2022-12-31 ENCOUNTER — Telehealth (INDEPENDENT_AMBULATORY_CARE_PROVIDER_SITE_OTHER): Payer: No Payment, Other | Admitting: Psychiatry

## 2022-12-31 ENCOUNTER — Encounter (HOSPITAL_COMMUNITY): Payer: Self-pay | Admitting: Psychiatry

## 2022-12-31 DIAGNOSIS — F331 Major depressive disorder, recurrent, moderate: Secondary | ICD-10-CM

## 2022-12-31 DIAGNOSIS — F419 Anxiety disorder, unspecified: Secondary | ICD-10-CM | POA: Diagnosis not present

## 2022-12-31 MED ORDER — DULOXETINE HCL 60 MG PO CPEP: 60.0000 mg | ORAL_CAPSULE | Freq: Every evening | ORAL | 3 refills | Status: DC

## 2022-12-31 NOTE — Patient Instructions (Signed)
Thank you for attending your appointment today.  -- We did not make any medication changes today. Please continue medications as prescribed.  Please do not make any changes to medications without first discussing with your provider. If you are experiencing a psychiatric emergency, please call 911 or present to your nearest emergency department. Additional crisis, medication management, and therapy resources are included below.  [facility]  [address], [place], [place] [address] [phone] WALK-IN URGENT CARE 24/7 FOR ANYONE [address], [place], [place]  [phone] Fax: [phone] [email or web address] *Interpreters available *Accepts all insurance and uninsured for Urgent Care needs *Accepts Medicaid and uninsured for outpatient treatment (below)      ONLY FOR [place] Residents  Below:    Outpatient New Patient Assessment/Therapy Walk-ins:        Monday -Thursday 8am until slots are full.        Every Friday 1pm-4pm  (first come, first served)                   New Patient Psychiatry/Medication Management        Monday-Friday 8am-11am (first come, first served)               For all walk-ins we ask that you arrive by 7:15am, because patients will be seen in the order of arrival.   

## 2023-01-13 ENCOUNTER — Ambulatory Visit (INDEPENDENT_AMBULATORY_CARE_PROVIDER_SITE_OTHER): Payer: No Payment, Other | Admitting: Mental Health

## 2023-01-13 DIAGNOSIS — F419 Anxiety disorder, unspecified: Secondary | ICD-10-CM

## 2023-01-13 DIAGNOSIS — F331 Major depressive disorder, recurrent, moderate: Secondary | ICD-10-CM

## 2023-01-13 NOTE — Progress Notes (Signed)
THERAPIST PROGRESS NOTE Virtual Visit via Video Note  I connected with Judith Hansen on 01/13/23 at 11:00 AM EDT by a video enabled telemedicine application and verified that I am speaking with the correct person using two identifiers.  Location: Patient: work Provider: home office   I discussed the limitations of evaluation and management by telemedicine and the availability of in person appointments. The patient expressed understanding and agreed to proceed.  I discussed the assessment and treatment plan with the patient. The patient was provided an opportunity to ask questions and all were answered. The patient agreed with the plan and demonstrated an understanding of the instructions.   The patient was advised to call back or seek an in-person evaluation if the symptoms worsen or if the condition fails to improve as anticipated.  I provided 48 minutes of non-face-to-face time during this encounter.   Judith Hansen, [facility]   Session Time: 11:03am ( 48 minutes)  Participation Level: Active  Behavioral Response: CasualAlertEuthymic  Type of Therapy: Individual Therapy  Treatment Goals addressed: STG: Judith Hansen increase management of moods(depression/anxiety) AEB development of x 3 effective coping skills with ability to complete x 1 task daily within the next 90 days.   ProgressTowards Goals: Progressing  Interventions: CBT and Supportive  Summary:  Judith Hansen is a 32 y.o. female who presents with dx of MDD and GAD. Presents alert and oriented; mood and affect adequate; stable. Judith Hansen presents engaged and receptive to interventions. Thought process logical' goal directed. Pleasant demeanor. Judith Hansen shares improvement in mood and shares to feel as if moods have been "stable." Session from work due to haven been called in on her day off. Shares to have some feelings of fustration with this, nothing for x 2 individuals to no longer work there and causing for her and  another coworker having to work increased shifts. Denies concerns for depression and anxiety at this time and reports for things to be going relatively well. Denies stressors. Notes for husband to have been helping around the house and upcoming ability to have car fixed. Shares plans coming up with husband to present to [place] and [place]. Reports desire to obtain new position and has been looking. Shares increased work life balance that has supported in reduction in depression and anxiety. "When I am off I do not answer the phone."Increased ability to get things done around the house and able to attend to one tasks needed daily. Denies SI/HI. Progress with goals; sxs stable.   Suicidal/Homicidal: Nowithout intent/plan  Therapist Response:  Therapist engaged Judith Hansen in tele-therapy session.Assessed for current level of functioning, sxs management and level of stressors. Provided safe space for Judith Hansen to share thoughts and feelings related to anxiety and feelings of depression, explored factors that have supported in increase level of functioning. Explored ongoing changes she would like to see in her life and steps she has made towards initiated those changes. Reviewed ability to engage in self-care and increase ability to instill work life balance. Assessed for safety, provided follow up appointment.   Plan: Return again in  x 4 weeks.  Diagnosis: MDD (major depressive disorder), recurrent episode, moderate (HCC)  Anxiety  Collaboration of Care: Other None  Patient/Guardian was advised Release of Information must be obtained prior to any record release in order to collaborate their care with an outside provider. Patient/Guardian was advised if they have not already done so to contact the registration department to sign all necessary forms in order for [place] to release information  regarding their care.   Consent: Patient/Guardian gives verbal consent for treatment and assignment of benefits for  services provided during this visit. Patient/Guardian expressed understanding and agreed to proceed.   Judith Hansen [place], [facility] 01/13/2023

## 2023-02-14 ENCOUNTER — Ambulatory Visit (INDEPENDENT_AMBULATORY_CARE_PROVIDER_SITE_OTHER): Payer: No Payment, Other | Admitting: Mental Health

## 2023-02-14 DIAGNOSIS — F331 Major depressive disorder, recurrent, moderate: Secondary | ICD-10-CM | POA: Diagnosis not present

## 2023-02-14 DIAGNOSIS — F419 Anxiety disorder, unspecified: Secondary | ICD-10-CM

## 2023-02-14 NOTE — Progress Notes (Signed)
THERAPIST PROGRESS NOTE Virtual Visit via Video Note  I connected with Judith Hansen on 02/14/23 at  9:00 AM EDT by a video enabled telemedicine application and verified that I am speaking with the correct person using two identifiers.  Location: Patient: home address on file Provider: home office   I discussed the limitations of evaluation and management by telemedicine and the availability of in person appointments. The patient expressed understanding and agreed to proceed.   I discussed the assessment and treatment plan with the patient. The patient was provided an opportunity to ask questions and all were answered. The patient agreed with the plan and demonstrated an understanding of the instructions.   The patient was advised to call back or seek an in-person evaluation if the symptoms worsen or if the condition fails to improve as anticipated.  I provided 48 minutes of non-face-to-face time during this encounter.   Dorris Singh, [facility]   Session Time: 9:01 am ( 48 minutes)  Participation Level: Active  Behavioral Response: CasualAlertAdequate  Type of Therapy: Individual Therapy  Treatment Goals addressed:  STG: [place] increase management of moods(depression/anxiety) AEB development of x 3 effective coping skills with ability to complete x 1 task daily within the next 90 days.   ProgressTowards Goals: Progressing  Interventions: CBT and Supportive  Summary: Judith Hansen is a 32 y.o. female who presents with dx of MDD and GAD. Presents alert and oriented; mood and affect adequate; stable. [name] presents engaged and receptive to interventions. Thought process logical; clear. Notes improvement in mood and denies concerns for depression and anxiety. " I am happy." Shares continues to have stressors however able to navigate and cope. Shares engaging in balance with spending time with friends, her husband and self-care with with reading, attending the pool. Notes  processing events well, " It could be worse, some things I just can't control." Shares continues to struggle asking for help from others at times and in working to allow self to be supported as needed. Shares some anxiety related to asking friend to switch shifts at work. Shares thoughts of why asking for help can be so triggering for her. Continues to explore employment change due to feeling as if she is burned out from work and notes finding someone who OD in the parking lot. Agrees to follow up with PCP appointment and exploring ability to obtain medicaid benefits. Progress with goals x 3 coping skills in place- going to pool, reading and cognitive coping. Sxs improvement. Denies SI/HI  Suicidal/Homicidal: Nowithout intent/plan  Therapist Response: Therapist engaged [place] in tele-therapy session.Assessed for current level of functioning, sxs management and level of stressors. Provided safe space for [name] to share thoughts and feelings related to sxs stability. Explored factors that have supported in increase in functioning. Explored dimensions of wellness and maintain balance in daily life. Encouraged ongoing processing events in balanced manner and not catastrophizing events. Explored difficulty in asking for help and provided supportive feedback. Encouraged following up with PCP appointment and provided resources. Reviewed session and provided follow up appointment.   Plan: Return again in  x 4 weeks.  Diagnosis: MDD (major depressive disorder), recurrent episode, moderate (HCC)  Anxiety  Collaboration of Care: Other None  Patient/Guardian was advised Release of Information must be obtained prior to any record release in order to collaborate their care with an outside provider. Patient/Guardian was advised if they have not already done so to contact the registration department to sign all necessary forms in order for  [place] to release information regarding their care.   Consent:  Patient/Guardian gives verbal consent for treatment and assignment of benefits for services provided during this visit. Patient/Guardian expressed understanding and agreed to proceed.   Stephan Minister [place], [facility] 02/14/2023

## 2023-03-19 ENCOUNTER — Ambulatory Visit (INDEPENDENT_AMBULATORY_CARE_PROVIDER_SITE_OTHER): Payer: No Payment, Other | Admitting: Mental Health

## 2023-03-19 DIAGNOSIS — F331 Major depressive disorder, recurrent, moderate: Secondary | ICD-10-CM

## 2023-03-19 DIAGNOSIS — F419 Anxiety disorder, unspecified: Secondary | ICD-10-CM

## 2023-03-19 NOTE — Progress Notes (Signed)
THERAPIST PROGRESS NOTE Virtual Visit via Video Note  I connected with Judith Hansen on 03/19/23 at 11:00 AM EDT by a video enabled telemedicine application and verified that I am speaking with the correct person using two identifiers.  Location: Patient: home address on file Provider: office   I discussed the limitations of evaluation and management by telemedicine and the availability of in person appointments. The patient expressed understanding and agreed to proceed.  I discussed the assessment and treatment plan with the patient. The patient was provided an opportunity to ask questions and all were answered. The patient agreed with the plan and demonstrated an understanding of the instructions.   The patient was advised to call back or seek an in-person evaluation if the symptoms worsen or if the condition fails to improve as anticipated.  I provided 46 minutes of non-face-to-face time during this encounter.   Dorris Singh, [facility]   Session Time: 11:05 am ( 46 minutes)  Participation Level: Active  Behavioral Response: CasualAlertEuthymic  Type of Therapy: Individual Therapy  Treatment Goals addressed: STG: Loretha increase management of moods(depression/anxiety) AEB development of x 3 effective coping skills with ability to complete x 1 task daily within the next 90 days.   ProgressTowards Goals: Progressing  Interventions: CBT and Supportive  Summary: Judith Hansen is a 32 y.o. female who presents with dx of MDD and GAD. Presents alert and oriented; mood and affect adequate; stable. Judith Hansen presents engaged and receptive to interventions. Thought process logical; clear. Notes improvement in mood and denies concerns for depression and anxiety; with sxs at manageable levels. Notes balance with home and work life and has been enjoying her summer with friends and husband. Concerned for health issues with cat. Shares concerns for husband and drinking behaviors at  this time but feels as if this will resolve. Notes increased energy and motivation and has been able to increase her attendance to home with kitchen and living room areas clean. Shares to feel happy and for things to be going well at this time. Looking forward to a trip to the beach later in the summer. Shares coping thoughts present and able to process events at helpful manner. Engaged in cognitive coping. Able to complete tasks daily. Progress with goals noted. Sleep and appetite WNL. Sxs stable. Denies SI/HI   Suicidal/Homicidal: Nowithout intent/plan  Therapist Response: Therapist engaged Judith Hansen in tele-therapy session.Assessed for current level of functioning, sxs management and level of stressors. Provided safe space for Judith Hansen to share thoughts and feelings related to sxs stability. Explored factors that have supported in increase in functioning and areas of improvement. Engaged Judith Hansen in discussing importance of balance healthy cognitions and connection to management of stress, emotions and behaviors. Encouraged providing option of seeking services for husband. Educated on ability to obtain medical appointment with no insurance and reminded of resource. Active empathic listening and providing supportive feedback. Reviewed session and provided follow up. No safety concerns reported.   Plan: Return again in  x 6 weeks.  Diagnosis: MDD (major depressive disorder), recurrent episode, moderate (HCC)  Anxiety  Collaboration of Care: Other None  Patient/Guardian was advised Release of Information must be obtained prior to any record release in order to collaborate their care with an outside provider. Patient/Guardian was advised if they have not already done so to contact the registration department to sign all necessary forms in order for [place] to release information regarding their care.   Consent: Patient/Guardian gives verbal consent for treatment and assignment of  benefits for services  provided during this visit. Patient/Guardian expressed understanding and agreed to proceed.   Stephan Minister [place], [facility] 03/19/2023

## 2023-03-28 NOTE — Progress Notes (Unsigned)
BH MD Outpatient Progress Note  03/31/2023 1:46 PM Judith Hansen  MRN:  [id number]  Assessment:  Judith Hansen presents for follow-up evaluation. Today, 03/31/23, patient reports psychiatric stability with improvement in mood and irritability, energy, motivation, and overall functionality (improved self care and exercise, keeping up with household responsibilities). Shares that she may try to conceive in near future, and extensive discussion was held regarding risks/benefits of below medications in pregnancy. Patient was amenable to continuing Cymbalta given benefit this medication has had for both depression and anxiety; given sparing use of Atarax and contraindication in pregnancy based on animal studies she was amenable to discontinuing use when actively TTC. All questions/concerns addressed.  RTC in 3 months by video.  Identifying Information: Judith Hansen is a 32 y.o. female with a history of MDD and anxiety who is an established patient with [facility] participating in follow-up via video conferencing.   Plan:  # MDD, recurrent  Anxiety Past medication trials: unknown Status of problem: stable Interventions: -- Continue Cymbalta 60 mg nightly  -- Continue hydroxyzine 12.5-25 mg daily PRN anxiety/sleep -- Continue individual psychotherapy with Stephan Minister, [facility]  # Prenatal counseling -- Patient reports she and husband have discussed they may try to conceive in near future; not currently using birth control or protection. -- Extensively reviewed risks/benefits of above medications in pregnancy specifically: -- Effexor: The informed consent discussion included the risk of untreated maternal depression and anxiety, which has included poor self-care, substance use, SI, preterm delivery, low BW, neonatal distress, toxic stress of the newborn and other adverse effects on fetal development, vs. the risk of SNRI use in pregnancy.  Discussed that there is limited  data on the use of duloxetine in pregnancy compared to other antidepressants however data on use of another SNRI, Effexor, showed no increase in risk of major malformation as compared to non-exposed controls. Discussed there have not been clear studies demonstrating increased risk of miscarriage from use of duloxetine. Discussed that duloxetine during pregnancy can cause temporary withdrawal symptoms in newborns soon after birth that might include breathing problems, jitteriness / tremors, more or less muscle tone than usual, irritability, problems sleeping, and trouble eating or regulating their body temperature. Discussed that symptoms are typically mild and go away on their own however baby may need increased monitoring or care to ensure resolution of these symptoms and encouraged patient make Ob/Gyn aware of all psychiatric medications to ensure appropriate monitoring.    -- Atarax: discussed recommendation that patient abstain from use of Atarax if actively trying to conceive given limited data and contraindication that exists from animal studies.  -- START prenatal vitamin. -- Recommend discontinuing use of nicotine products. -- Provided with MotherToBaby resource for further reading and included handouts on risks of continued vaping/nicotine use on pregnancy outcomes.   Patient was given contact information for behavioral health clinic and was instructed to call 911 for emergencies.   Subjective:  Chief Complaint:  Chief Complaint  Patient presents with   Medication Management    Interval History:   Today, patient reports she is doing "great" and states most days she has had good level of energy and motivation. Keeping up with household chores, taking care of pets, balancing work/life better. Denies passive/active SI although feels frustrated with world events. Denies HI, AVH.   Some trouble falling sleeping at night - states she often has TV on or will play nintendo switch at night.  Sleeping at least 8 hours nightly. Proper sleep hygiene practices  reviewed. Discussed avoiding nicotine products at night. Has not tried Atarax at night for sleep and would be amenable to trialing at night as needed for sleep. Some decreased appetite in mornings due to reflux but otherwise normal. Exercising more regularly.   Reports that she and her husband may consider trying to conceive in the near future. Not currently on birth control or using protection. Reviewed risk/benefit profile of psychiatric medications in pregnancy and importance of ensuring stability of mental health. Provided with resources for further reading. Provided recommendation to abstain from use of Atarax if actively trying to conceive given limited data and contraindication that exists from animal studies. Recommended patient start PNV.   All questions/concerns addressed.   Visit Diagnosis:    ICD-10-CM   1. Episode of recurrent major depressive disorder, unspecified depression episode severity (HCC)  F33.9 DULoxetine (CYMBALTA) 60 MG capsule    2. Anxiety  F41.9 DULoxetine (CYMBALTA) 60 MG capsule      Past Psychiatric History:  Diagnoses: MDD, anxiety; historical diagnosis of ADHD Hospitalizations: x2 at 68 and 32 yo for SAs (below) Suicide attempts: x2 at 19 and 32 yo via cutting wrists SIB: denies Current access to guns: believes her husband has access but she does not Substance use:   -- Etoh: socially; once every 3 months  -- Tobacco: vapes throughout the day  -- Denies use of cannabis or illicit drugs  -- Caffeine: 1 red bull per day  Past Medical History:  Past Medical History:  Diagnosis Date   ADD (attention deficit disorder)    Anxiety    Asthma    Depression    Seasonal allergies     Past Surgical History:  Procedure Laterality Date   FRACTURE SURGERY  2004   rt arm has plate   ORIF FOREARM FRACTURE Right    WISDOM TOOTH EXTRACTION     Family Psychiatric History:  Mother: alcohol  misuse, depression  Family History:  Family History  Problem Relation Age of Onset   Alcohol abuse Mother    Arthritis Mother    Depression Mother    Heart disease Maternal Grandfather     Social History:  Social History   Socioeconomic History   Marital status: Significant Other    Spouse name: Not on file   Number of children: Not on file   Years of education: Not on file   Highest education level: Not on file  Occupational History   Not on file  Tobacco Use   Smoking status: Former    Current packs/day: 0.00    Types: Cigarettes    Quit date: 12/02/2015    Years since quitting: 7.3   Smokeless tobacco: Never  Vaping Use   Vaping status: Every Day  Substance and Sexual Activity   Alcohol use: Yes    Comment: occasional; once every 3 months   Drug use: No   Sexual activity: Yes    Birth control/protection: None  Other Topics Concern   Not on file  Social History Narrative   Entered 03/2014:   Works 2nd shift at [employer] with her mom   Social Determinants of Health   Financial Resource Strain: Medium Risk (09/23/2022)   Overall Financial Resource Strain (CARDIA)    Difficulty of Paying Living Expenses: Somewhat hard  Food Insecurity: No Food Insecurity (09/23/2022)   Hunger Vital Sign    Worried About Running Out of Food in the Last Year: Never true    Ran Out of Food  in the Last Year: Never true  Transportation Needs: No Transportation Needs (09/23/2022)   PRAPARE - Administrator, Civil Service (Medical): No    Lack of Transportation (Non-Medical): No  Physical Activity: Inactive (09/23/2022)   Exercise Vital Sign    Days of Exercise per Week: 0 days    Minutes of Exercise per Session: 0 min  Stress: Stress Concern Present (09/23/2022)   [employer] of Occupational Health - Occupational Stress Questionnaire    Feeling of Stress : To some extent  Social Connections: Moderately Isolated (09/23/2022)   Social Connection and Isolation  Panel [NHANES]    Frequency of Communication with Friends and Family: More than three times a week    Frequency of Social Gatherings with Friends and Family: Never    Attends Religious Services: Never    Database administrator or Organizations: No    Attends Engineer, structural: Never    Marital Status: Married    Allergies: No Known Allergies  Current Medications: Current Outpatient Medications  Medication Sig Dispense Refill   albuterol (PROVENTIL HFA;VENTOLIN HFA) 108 (90 Base) MCG/ACT inhaler Inhale 1-2 puffs into the lungs every 6 (six) hours as needed for wheezing or shortness of breath. 1 Inhaler 0   cetirizine (ZYRTEC) 10 MG tablet Take 10 mg by mouth daily.     DULoxetine (CYMBALTA) 60 MG capsule Take 1 capsule (60 mg total) by mouth at bedtime. 30 capsule 3   fluconazole (DIFLUCAN) 150 MG tablet Take 1 tablet (150 mg total) by mouth daily. Take one tablet now and one in 3 days if you are still having symptoms 2 tablet 0   hydrOXYzine (ATARAX) 25 MG tablet Take 1 tablet (25 mg total) by mouth 3 (three) times daily. (Patient not taking: Reported on 03/31/2023) 90 tablet 2   loratadine (CLARITIN) 10 MG tablet Take 10 mg by mouth as needed for allergies.     No current facility-administered medications for this visit.    ROS: Denies any physical complaints  Objective:  Psychiatric Specialty Exam: There were no vitals taken for this visit.There is no height or weight on file to calculate BMI.  General Appearance: Casual and Well Groomed  Eye Contact:  Good  Speech:  Clear and Coherent and Normal Rate  Volume:  Normal  Mood:   "really good"  Affect:   Euthymic; calm  Thought Content:  Denies AVH; paranoia; IOR    Suicidal Thoughts:  No  Homicidal Thoughts:  No  Thought Process:  Goal Directed and Linear  Orientation:  Full (Time, Place, and Person)    Memory:   Grossly intact  Judgment:  Good  Insight:  Good  Concentration:  Concentration: Good  Recall:  NA   Fund of Knowledge: Good  Language: Good  Psychomotor Activity:  Normal  Akathisia:  NA  AIMS (if indicated): not done  Assets:  Communication Skills Desire for Improvement Housing Intimacy Leisure Time Physical Health Social Support Talents/Skills Transportation Vocational/Educational  ADL's:  Intact  Cognition: WNL  Sleep:  Good   PE: General: sits comfortably in view of camera; no acute distress  Pulm: no increased work of breathing on room air  MSK: all extremity movements appear intact  Neuro: no focal neurological deficits observed  Gait & Station: unable to assess by video    Metabolic Disorder Labs: No results found for: "HGBA1C", "MPG" No results found for: "PROLACTIN" No results found for: "CHOL", "TRIG", "HDL", "CHOLHDL", "VLDL", "LDLCALC" No results found  for: "TSH"  Therapeutic Level Labs: No results found for: "LITHIUM" No results found for: "VALPROATE" No results found for: "CBMZ"  Screenings:  AUDIT    Flowsheet Row Counselor from 09/23/2022 in [facility]  Alcohol Use Disorder Identification Test Final Score (AUDIT) 3      GAD-7    Flowsheet Row Video Visit from 10/30/2022 in [facility] Counselor from 09/23/2022 in [facility] Video Visit from 05/30/2022 in [facility] Video Visit from 02/26/2022 in [facility] Video Visit from 09/28/2021 in [facility]  Total GAD-7 Score 10 8 3 8 4       PHQ2-9    Flowsheet Row Video Visit from 10/30/2022 in [facility] Counselor from 09/23/2022 in [facility] Video Visit from 05/30/2022 in [facility] Video Visit from 02/26/2022 in [facility] Video Visit from 09/28/2021 in [facility]  PHQ-2 Total Score 3  2 2 3 1   PHQ-9 Total Score 8 7 4 7 4       Flowsheet Row Video Visit from 10/30/2022 in [facility] Counselor from 09/23/2022 in [facility] ED from 01/04/2021 in [facility] RISK CATEGORY Low Risk Low Risk Error: Question 6 not populated       Collaboration of Care: Collaboration of Care: Medication Management AEB ongoing medication management and Psychiatrist AEB established with this provider  Patient/Guardian was advised Release of Information must be obtained prior to any record release in order to collaborate their care with an outside provider. Patient/Guardian was advised if they have not already done so to contact the registration department to sign all necessary forms in order for [place] to release information regarding their care.   Consent: Patient/Guardian gives verbal consent for treatment and assignment of benefits for services provided during this visit. Patient/Guardian expressed understanding and agreed to proceed.   Televisit via video: I connected with patient on 03/31/23 at 10:30 AM EDT by a video enabled telemedicine application and verified that I am speaking with the correct person using two identifiers.  Location: Patient: friend's home in [place] Provider: remote office in [place]   I discussed the limitations of evaluation and management by telemedicine and the availability of in person appointments. The patient expressed understanding and agreed to proceed.  I discussed the assessment and treatment plan with the patient. The patient was provided an opportunity to ask questions and all were answered. The patient agreed with the plan and demonstrated an understanding of the instructions.   The patient was advised to call back or seek an in-person evaluation if the symptoms worsen or if the condition fails to improve as anticipated.  I provided 40 minutes dedicated to the care of this  patient via video on the date of this encounter to include chart review, face-to-face time with the patient, medication management/counseling, counseling on safety of medications in pregnancy.  Rylin Saez A Xylina Rhoads 03/31/2023, 1:46 PM

## 2023-03-31 ENCOUNTER — Telehealth (INDEPENDENT_AMBULATORY_CARE_PROVIDER_SITE_OTHER): Payer: No Payment, Other | Admitting: Psychiatry

## 2023-03-31 ENCOUNTER — Encounter (HOSPITAL_COMMUNITY): Payer: Self-pay | Admitting: Psychiatry

## 2023-03-31 DIAGNOSIS — F419 Anxiety disorder, unspecified: Secondary | ICD-10-CM

## 2023-03-31 DIAGNOSIS — F339 Major depressive disorder, recurrent, unspecified: Secondary | ICD-10-CM

## 2023-03-31 MED ORDER — DULOXETINE HCL 60 MG PO CPEP: 60.0000 mg | ORAL_CAPSULE | Freq: Every evening | ORAL | 3 refills | Status: DC

## 2023-03-31 MED ORDER — PRENATAL VITAMIN/MIN +DHA 27-0.8-200 MG PO CAPS: 1.0000 | ORAL_CAPSULE | Freq: Every morning | ORAL | Status: AC

## 2023-03-31 NOTE — Patient Instructions (Addendum)
Thank you for attending your appointment today.  -- We did not make any medication changes today. Please continue medications as prescribed. -- A great resource to read about safety of medications in pregnancy is SunglassRentals.fi. Here is the fact sheet on Cymbalta:  PokerReunion.com.cy -- START a prenatal vitamin. -- REDUCE and STOP smoking as nicotine has been associated with negative pregnancy outcomes.  LinearBlog.com.br  GainPain.com.cy  Please do not make any changes to medications without first discussing with your provider. If you are experiencing a psychiatric emergency, please call 911 or present to your nearest emergency department. Additional crisis, medication management, and therapy resources are included below.  [facility]  [address] [place], [place] [address] [phone] WALK-IN URGENT CARE 24/7 FOR ANYONE [address] [place], [place]  [phone] Fax: [phone] [email or web address] *Interpreters available *Accepts all insurance and uninsured for Urgent Care needs *Accepts Medicaid and uninsured for outpatient treatment (below)      [facility]  Below:    Outpatient New Patient Assessment/Therapy Walk-ins:        Monday -Thursday 8am until slots are full.        Every Friday 1pm-4pm  (first come, first served)                   New Patient Psychiatry/Medication Management        Monday-Friday 8am-11am (first come, first served)               For all walk-ins we ask that you arrive by 7:15am, because patients will be seen in the order of arrival.

## 2023-05-06 ENCOUNTER — Ambulatory Visit (HOSPITAL_COMMUNITY): Payer: No Payment, Other | Admitting: Mental Health

## 2023-05-06 DIAGNOSIS — F419 Anxiety disorder, unspecified: Secondary | ICD-10-CM

## 2023-05-06 DIAGNOSIS — F331 Major depressive disorder, recurrent, moderate: Secondary | ICD-10-CM | POA: Diagnosis not present

## 2023-05-06 NOTE — Progress Notes (Signed)
THERAPIST PROGRESS NOTE Virtual Visit via Video Note  I connected with Judith Hansen on 05/06/23 at  9:00 AM EDT by a video enabled telemedicine application and verified that I am speaking with the correct person using two identifiers.  Location: Patient: work Provider: office   I discussed the limitations of evaluation and management by telemedicine and the availability of in person appointments. The patient expressed understanding and agreed to proceed.   I discussed the assessment and treatment plan with the patient. The patient was provided an opportunity to ask questions and all were answered. The patient agreed with the plan and demonstrated an understanding of the instructions.   The patient was advised to call back or seek an in-person evaluation if the symptoms worsen or if the condition fails to improve as anticipated.  I provided 48 minutes of non-face-to-face time during this encounter.   Dorris Singh, [facility]   Session Time: 9:04 am  Participation Level: Active  Behavioral Response: CasualAlertAdequate  Type of Therapy: Individual Therapy  Treatment Goals addressed: STG: Judith Hansen increase management of moods(depression/anxiety) AEB development of x 3 effective coping skills with ability to complete x 1 task daily within the next 90 days.   Active     OP Depression     LTG: Increase coping skills to manage depression and improve ability to perform daily activities (Progressing)     Start:  09/23/22    Expected End:  09/23/23         STG: Judith Hansen increase management of moods(depression/anxiety) AEB development of x 3 effective coping skills with ability to complete x 1 task daily within the next 90 days.  (Progressing)     Start:  09/23/22    Expected End:  09/23/23       Goal Note     05/06/23: Judith Hansen has made great progress in ability to manage moods. Notes coping skills of reading, spending time with friends and spending time with animals. Denies  concerns for depression with anxiety to be manageable. Able to complete tasks as needed and incorporated daily self-care and work/life balance         Work with Judith Hansen to track symptoms, triggers, and/or skill use through a mood chart, diary card, or journal     Start:  09/23/22         Therapist will administer the PHQ-9 at weekly intervals for the next 12 weeks     Start:  09/23/22         Work with Judith Hansen to identify the major components of a recent episode of depression: physical symptoms, major thoughts and images, and major behaviors they experienced     Start:  09/23/22         Judith Hansen will identify 3 personal goals for managing depression symptoms to work on during the current treatment episode     Start:  09/23/22         Therapist will educate patient on cognitive distortions and the rationale for treatment of depression     Start:  09/23/22            ProgressTowards Goals: Progressing  Interventions: CBT and Supportive  Summary: Judith Hansen is a 32 y.o. female who presents with dx of MDD and GAD. Presents alert and oriented; mood and affect adequate; stable. Judith Hansen presents engaged and receptive to interventions. Thought process logical; clear. Notes improvement in mood and denies concerns for depression and anxiety; with sxs at manageable levels. Notes balance with home and  work. 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 Shares thoughts on engagement with parents with desire to spend increased time with them however can be triggered about her childhood sharing for parents "should not have been parents." Noting for them to have made comments on her weight and yelling at her. Notes for father to have made comments about her not being a mother to "be in the cards for her."  Noting judgement from parents. Shares relationships have improved but can have difficulty. Processes with therapist working to control access parents have to her and degree in which she feels comfortable. Shares attendance to needed tasks daily with plans to [employer] today. Coping and managing moods with reading, animals and playing the game. Denies excessive depression or anxiety. GAD: 9 ; PHQ: 10. Progress with goals; sxs stable.    Suicidal/Homicidal: Nowithout intent/plan  Therapist Response: Therapist engaged [place] in tele-therapy session.Assessed for current level of functioning, sxs management and level of stressors. Provided safe space for Brightyn to share thoughts and feelings related to sxs stability. Explored factors that have supported in maintaining stability in moods. Supported Hospital doctor in processing thoughts and feeling in regards to her parents and her desire to engage in parenthood. Discussed emotional intelligence and ways in which poor communication and emotional wellness can be passed down and factors that influence. Encouraged ongoing work/life balance and developing life of whole person wellness. Reviewed session and provided follow up. No safety concerns reported.   Plan: Return again in x 6 weeks.  Diagnosis: MDD (major depressive disorder), recurrent episode, moderate (HCC)  Anxiety  Collaboration of Care: Other None  Patient/Guardian was advised Release of Information must be obtained prior to any record release in order to collaborate their care with an outside provider. Patient/Guardian was advised if they have not already done so to contact the registration department to sign all necessary forms in order for [place] to release information regarding their care.   Consent: Patient/Guardian gives verbal consent for treatment and assignment of benefits for services provided during this visit. Patient/Guardian expressed understanding and agreed to proceed.   Stephan Minister  [place], [facility] 05/06/2023

## 2023-06-19 ENCOUNTER — Ambulatory Visit (HOSPITAL_COMMUNITY): Payer: No Payment, Other | Admitting: Mental Health

## 2023-06-19 DIAGNOSIS — F331 Major depressive disorder, recurrent, moderate: Secondary | ICD-10-CM

## 2023-06-19 DIAGNOSIS — F419 Anxiety disorder, unspecified: Secondary | ICD-10-CM

## 2023-06-19 NOTE — Progress Notes (Signed)
THERAPIST PROGRESS NOTE Virtual Visit via Video Note  I connected with [name] on 06/19/23 at  4:00 PM EDT by a video enabled telemedicine application and verified that I am speaking with the correct person using two identifiers.  Location: Patient: work  Provider: office   I discussed the limitations of evaluation and management by telemedicine and the availability of in person appointments. The patient expressed understanding and agreed to proceed.  I discussed the assessment and treatment plan with the patient. The patient was provided an opportunity to ask questions and all were answered. The patient agreed with the plan and demonstrated an understanding of the instructions.   The patient was advised to call back or seek an in-person evaluation if the symptoms worsen or if the condition fails to improve as anticipated.  I provided 55 minutes of non-face-to-face time during this encounter.   Dorris Singh, [facility]   Session Time: 4:02 pm (55 minutes)  Participation Level: Active  Behavioral Response: CasualAlertAnxious  Type of Therapy: Individual Therapy  Treatment Goals addressed: STG: Laretha increase management of moods(depression/anxiety) AEB development of x 3 effective coping skills with ability to complete x 1 task daily within the next 90 days.   ProgressTowards Goals: Progressing  Interventions: CBT and Supportive   Summary: Jenaye Galliher is a 32 y.o. female who presents with dx of MDD and GAD. Presents alert and oriented; mood and affect adequate; stable. Naseem presents engaged and receptive to interventions. Thought process logical; clear. Notes continuing working to manage moods and shares to have just came from job interview. Shares in need of additional income and shares financial stressors with therapist. Shella Maxim an array of repairs that need to be completed in home. Notes thoughts on desire to have a child and expresses concerns. Thoughts  content high in maladaptive thinking with catastrophizing of thoughts. Notes fear of child birth and an array of things in which she feels can go wrong. Shares thoughts on husband and his previous drinking behaviors and notes for this to have improved. Shares decreased in attending to self-care with increase in feelings of stress. Shares decline in attending to tasks around the house with feelings of overwhelm. Explores with therapist working to refocus on goal and increasing attendance to self. Denies SI/HI. Ongoing work towards goals.    Suicidal/Homicidal: Nowithout intent/plan  Therapist Response: Therapist engaged [place] in tele-therapy session.Assessed for current level of functioning, sxs management and level of stressors. Provided safe space for Natacha to share thoughts and feelings related to sxs and current stressors. Provided support and encouragement; validated feelings. Encouraged to continue to explore job opportunities. Normalized feelings and concerns for childbirth with also challenging thoughts and identifying distorted thoughts present. Supported in identifying ways to support in decreasing fears and increasing education on childbirth and child rearing. Explored thoughts on current state of marriage and husbands drinking behaviors. Encouraged working to increase self-care to support in stress management and return of development of routine to support in attending to tasks. Reviewed session and provided follow up.  Plan: Return again in  x 6 weeks.  Diagnosis: MDD (major depressive disorder), recurrent episode, moderate (HCC)  Anxiety  Collaboration of Care: Other None  Patient/Guardian was advised Release of Information must be obtained prior to any record release in order to collaborate their care with an outside provider. Patient/Guardian was advised if they have not already done so to contact the registration department to sign all necessary forms in order for [place] to release  information regarding their care.   Consent: Patient/Guardian gives verbal consent for treatment and assignment of benefits for services provided during this visit. Patient/Guardian expressed understanding and agreed to proceed.   Stephan Minister [place], [facility] 06/19/2023

## 2023-07-01 NOTE — Progress Notes (Unsigned)
BH MD Outpatient Progress Note  07/02/2023 10:20 AM Judith Hansen  MRN:  [id number]  Assessment:  Judith Hansen presents for follow-up evaluation. Today, 07/02/23, patient reports continued psychiatric stability outside of irritability in luteal phase of menses although she does not appear to meet full criteria for either PMS/PMDD at this time given lack of related dysfunction. She identifies continued benefit from below medication. Engaged patient in preconception counseling with recommendation to start PNV and stop use of all nicotine products. No other changes to plan of care at this time.   RTC in 3 months by video.  Identifying Information: Judith Hansen is a 32 y.o. female with a history of MDD and anxiety who is an established patient with [facility] participating in follow-up via video conferencing.   Plan:  # MDD, recurrent  Anxiety Past medication trials: unknown Status of problem: stable Interventions: -- Continue Cymbalta 60 mg nightly  -- Continue hydroxyzine 12.5-25 mg daily PRN anxiety/sleep (not currently requiring) -- Continue individual psychotherapy with Stephan Minister, [facility]  # Prenatal counseling -- Patient reports she and husband have discussed they may try to conceive in near future; not currently using birth control or protection. -- Extensively reviewed risks/benefits of above medications in pregnancy specifically: -- Cymbalta: See note 03/31/23 for informed consent discussion.   -- Atarax: discussed recommendation that patient abstain from use of Atarax if actively trying to conceive given limited data and contraindication that exists from animal studies.  -- START prenatal vitamin. -- Recommended discontinuing use of nicotine products and psychoeducation provided on risks. -- Previously provided with MotherToBaby resource for further reading and included handouts on risks of continued vaping/nicotine use on pregnancy  outcomes.   Patient was given contact information for behavioral health clinic and was instructed to call 911 for emergencies.   Subjective:  Chief Complaint:  Chief Complaint  Patient presents with   Medication Management    Interval History:   Judith Hansen reports she is doing "good" and endorses adherence with Cymbalta. Has not required Atarax and anxiety has been manageable. Has fallen out of habit of going to the gym which she attributes to long work hours. Explored other strategies for physical activity. Checked in on family planning - not actively TTC but not using protection. Continues to vape and reviewed risks of tobacco use in pregnancy with recommendation for discontinuation. Encouraged patient to start PNV now as well.   Mood "fluctuates" but denies persistent periods of depression, irritability, or anxiety. Notes worsened irritability associated with cycles but only lasts a few hours at a time for about 2-3 days. Reports relief once cycle starts.  Finds it helpful to spend more time alone during these moments. Reports she has dealt with this for years and has learned how to manage it; does not feel it leads to any dysfunction.  No questions/concerns at this time and amenable to continuing medications as rx.  Visit Diagnosis:    ICD-10-CM   1. Recurrent major depressive disorder, in full remission (HCC)  F33.42 DULoxetine (CYMBALTA) 60 MG capsule    2. Anxiety  F41.9 DULoxetine (CYMBALTA) 60 MG capsule     Past Psychiatric History:  Diagnoses: MDD, anxiety; historical diagnosis of ADHD Hospitalizations: x2 at 28 and 32 yo for SAs (below) Suicide attempts: x2 at 43 and 32 yo via cutting wrists SIB: denies Current access to guns: believes her husband has access but she does not Substance use:   -- Etoh: socially; once every 3 months  -- Tobacco:  vapes throughout the day  -- Denies use of cannabis or illicit drugs  -- Caffeine: 1 red bull per day  Past Medical History:   Past Medical History:  Diagnosis Date   ADD (attention deficit disorder)    Anxiety    Asthma    Depression    Seasonal allergies     Past Surgical History:  Procedure Laterality Date   FRACTURE SURGERY  2004   rt arm has plate   ORIF FOREARM FRACTURE Right    WISDOM TOOTH EXTRACTION     Family Psychiatric History:  Mother: alcohol misuse, depression  Family History:  Family History  Problem Relation Age of Onset   Alcohol abuse Mother    Arthritis Mother    Depression Mother    Heart disease Maternal Grandfather     Social History:  Social History   Socioeconomic History   Marital status: Significant Other    Spouse name: Not on file   Number of children: Not on file   Years of education: Not on file   Highest education level: Not on file  Occupational History   Not on file  Tobacco Use   Smoking status: Former    Current packs/day: 0.00    Types: Cigarettes    Quit date: 12/02/2015    Years since quitting: 7.5   Smokeless tobacco: Never  Vaping Use   Vaping status: Every Day  Substance and Sexual Activity   Alcohol use: Yes    Comment: occasional; once every 3 months   Drug use: No   Sexual activity: Yes    Birth control/protection: None  Other Topics Concern   Not on file  Social History Narrative   Entered 03/2014:   Works 2nd shift at [employer] with her mom   Social Determinants of Health   Financial Resource Strain: Medium Risk (09/23/2022)   Overall Financial Resource Strain (CARDIA)    Difficulty of Paying Living Expenses: Somewhat hard  Food Insecurity: No Food Insecurity (09/23/2022)   Hunger Vital Sign    Worried About Running Out of Food in the Last Year: Never true    Ran Out of Food in the Last Year: Never true  Transportation Needs: No Transportation Needs (09/23/2022)   PRAPARE - Administrator, Civil Service (Medical): No    Lack of Transportation (Non-Medical): No  Physical Activity: Inactive (09/23/2022)    Exercise Vital Sign    Days of Exercise per Week: 0 days    Minutes of Exercise per Session: 0 min  Stress: Stress Concern Present (09/23/2022)   [employer] of Occupational Health - Occupational Stress Questionnaire    Feeling of Stress : To some extent  Social Connections: Moderately Isolated (09/23/2022)   Social Connection and Isolation Panel [NHANES]    Frequency of Communication with Friends and Family: More than three times a week    Frequency of Social Gatherings with Friends and Family: Never    Attends Religious Services: Never    Database administrator or Organizations: No    Attends Engineer, structural: Never    Marital Status: Married    Allergies: No Known Allergies  Current Medications: Current Outpatient Medications  Medication Sig Dispense Refill   albuterol (PROVENTIL HFA;VENTOLIN HFA) 108 (90 Base) MCG/ACT inhaler Inhale 1-2 puffs into the lungs every 6 (six) hours as needed for wheezing or shortness of breath. 1 Inhaler 0   cetirizine (ZYRTEC) 10 MG tablet Take 10 mg by  mouth daily.     DULoxetine (CYMBALTA) 60 MG capsule Take 1 capsule (60 mg total) by mouth at bedtime. 90 capsule 1   fluconazole (DIFLUCAN) 150 MG tablet Take 1 tablet (150 mg total) by mouth daily. Take one tablet now and one in 3 days if you are still having symptoms 2 tablet 0   hydrOXYzine (ATARAX) 25 MG tablet Take 1 tablet (25 mg total) by mouth 3 (three) times daily. (Patient not taking: Reported on 03/31/2023) 90 tablet 2   loratadine (CLARITIN) 10 MG tablet Take 10 mg by mouth as needed for allergies.     Prenatal Vit-Fe Sulfate-FA-DHA (PRENATAL VITAMIN/MIN +DHA) 27-0.8-200 MG CAPS Take 1 capsule by mouth in the morning.     No current facility-administered medications for this visit.    ROS: Denies any physical complaints  Objective:  Psychiatric Specialty Exam: There were no vitals taken for this visit.There is no height or weight on file to calculate BMI.  General  Appearance: Casual and Fairly Groomed  Eye Contact:  Good  Speech:  Clear and Coherent and Normal Rate  Volume:  Normal  Mood:   "good"  Affect:   Euthymic; calm  Thought Content:  Denies AVH; paranoia; IOR    Suicidal Thoughts:  No  Homicidal Thoughts:  No  Thought Process:  Goal Directed and Linear  Orientation:  Full (Time, Place, and Person)    Memory:   Grossly intact  Judgment:  Good  Insight:  Good  Concentration:  Concentration: Good  Recall:  NA  Fund of Knowledge: Good  Language: Good  Psychomotor Activity:  Normal  Akathisia:  NA  AIMS (if indicated): not done  Assets:  Communication Skills Desire for Improvement Housing Intimacy Leisure Time Physical Health Social Support Talents/Skills Transportation Vocational/Educational  ADL's:  Intact  Cognition: WNL  Sleep:  Good   PE: General: sits comfortably in view of camera; no acute distress  Pulm: no increased work of breathing on room air  MSK: all extremity movements appear intact  Neuro: no focal neurological deficits observed  Gait & Station: unable to assess by video    Metabolic Disorder Labs: No results found for: "HGBA1C", "MPG" No results found for: "PROLACTIN" No results found for: "CHOL", "TRIG", "HDL", "CHOLHDL", "VLDL", "LDLCALC" No results found for: "TSH"  Therapeutic Level Labs: No results found for: "LITHIUM" No results found for: "VALPROATE" No results found for: "CBMZ"  Screenings:  AUDIT    Flowsheet Row Counselor from 09/23/2022 in [facility]  Alcohol Use Disorder Identification Test Final Score (AUDIT) 3      GAD-7    Flowsheet Row Counselor from 05/06/2023 in [facility] Video Visit from 10/30/2022 in [facility] Counselor from 09/23/2022 in [facility] Video Visit from 05/30/2022 in [facility] Video Visit from 02/26/2022 in [facility]  Total GAD-7 Score 9 10 8 3 8       [phone]    Flowsheet Row Counselor from 05/06/2023 in [facility] Video Visit from 10/30/2022 in [facility] Counselor from 09/23/2022 in [facility] Video Visit from 05/30/2022 in [facility] Video Visit from 02/26/2022 in [facility]  PHQ-2 Total Score 2 3 2 2 3   PHQ-9 Total Score 10 8 7 4 7       Flowsheet Row Video Visit from 10/30/2022 in  [facility] Counselor from 09/23/2022 in [facility] ED from 01/04/2021 in [facility] at [facility] RISK CATEGORY Low Risk Low Risk Error: Question 6 not populated       Collaboration of Care: Collaboration of Care: Medication Management AEB ongoing medication management and Psychiatrist AEB established with this provider  Patient/Guardian was advised Release of Information must be obtained prior to any record release in order to collaborate their care with an outside provider. Patient/Guardian was advised if they have not already done so to contact the registration department to sign all necessary forms in order for [place] to release information regarding their care.   Consent: Patient/Guardian gives verbal consent for treatment and assignment of benefits for services provided during this visit. Patient/Guardian expressed understanding and agreed to proceed.   Televisit via video: I connected with patient on 07/02/23 at 10:00 AM EDT by a video enabled telemedicine application and verified that I am speaking with the correct person using two identifiers.  Location: Patient: home address in [place] Provider: remote office in [place]   I discussed the limitations of evaluation and management by telemedicine and the availability of in person appointments. The patient expressed  understanding and agreed to proceed.  I discussed the assessment and treatment plan with the patient. The patient was provided an opportunity to ask questions and all were answered. The patient agreed with the plan and demonstrated an understanding of the instructions.   The patient was advised to call back or seek an in-person evaluation if the symptoms worsen or if the condition fails to improve as anticipated.  I provided 25 minutes dedicated to the care of this patient via video on the date of this encounter to include chart review, face-to-face time with the patient, medication management/counseling, preconception counseling  Judith Hansen 07/02/2023, 10:20 AM

## 2023-07-02 ENCOUNTER — Encounter (HOSPITAL_COMMUNITY): Payer: Self-pay | Admitting: Psychiatry

## 2023-07-02 ENCOUNTER — Telehealth (HOSPITAL_COMMUNITY): Payer: No Payment, Other | Admitting: Psychiatry

## 2023-07-02 DIAGNOSIS — F3342 Major depressive disorder, recurrent, in full remission: Secondary | ICD-10-CM

## 2023-07-02 DIAGNOSIS — F419 Anxiety disorder, unspecified: Secondary | ICD-10-CM

## 2023-07-02 MED ORDER — DULOXETINE HCL 60 MG PO CPEP: 60.0000 mg | ORAL_CAPSULE | Freq: Every evening | ORAL | 1 refills | Status: DC

## 2023-07-02 NOTE — Patient Instructions (Addendum)
Thank you for attending your appointment today.  -- START prenatal vitamin -- ABSTAIN from nicotine use -- Please continue other medications as prescribed.  Please do not make any changes to medications without first discussing with your provider. If you are experiencing a psychiatric emergency, please call 911 or present to your nearest emergency department. Additional crisis, medication management, and therapy resources are included below.  [facility]  [address] [place], [place] [address] [phone] WALK-IN URGENT CARE 24/7 FOR ANYONE [address] [place], [place]  [phone] Fax: [phone] [email or web address] *Interpreters available *Accepts all insurance and uninsured for Urgent Care needs *Accepts Medicaid and uninsured for outpatient treatment (below)      ONLY FOR [facility]  Below:    Outpatient New Patient Assessment/Therapy Walk-ins:        Monday -Thursday 8am until slots are full.        Every Friday 1pm-4pm  (first come, first served)                   New Patient Psychiatry/Medication Management        Monday-Friday 8am-11am (first come, first served)               For all walk-ins we ask that you arrive by 7:15am, because patients will be seen in the order of arrival.

## 2023-08-15 ENCOUNTER — Ambulatory Visit (HOSPITAL_COMMUNITY): Payer: No Payment, Other | Admitting: Mental Health

## 2023-09-30 NOTE — Progress Notes (Unsigned)
Patient did not connect for virtual psychiatric medication management appointment on 10/01/23 at 11AM. Sent secure video link with no response. Left VM with callback number to reschedule.  Daine Gip, MD 10/01/23

## 2023-10-01 ENCOUNTER — Encounter (HOSPITAL_COMMUNITY): Payer: No Payment, Other | Admitting: Psychiatry

## 2023-10-01 ENCOUNTER — Encounter (HOSPITAL_COMMUNITY): Payer: Self-pay

## 2023-10-10 ENCOUNTER — Ambulatory Visit (INDEPENDENT_AMBULATORY_CARE_PROVIDER_SITE_OTHER): Payer: No Payment, Other | Admitting: Mental Health

## 2023-10-10 DIAGNOSIS — F419 Anxiety disorder, unspecified: Secondary | ICD-10-CM

## 2023-10-10 DIAGNOSIS — F331 Major depressive disorder, recurrent, moderate: Secondary | ICD-10-CM

## 2023-10-10 NOTE — Progress Notes (Signed)
   THERAPIST PROGRESS NOTE Virtual Visit via Video Note  I connected with Judith Hansen on 10/10/23 at  9:00 AM EST by a video enabled telemedicine application and verified that I am speaking with the correct person using two identifiers.  Location: Patient: home address on file Provider: home office   I discussed the limitations of evaluation and management by telemedicine and the availability of in person appointments. The patient expressed understanding and agreed to proceed.  I discussed the assessment and treatment plan with the patient. The patient was provided an opportunity to ask questions and all were answered. The patient agreed with the plan and demonstrated an understanding of the instructions.   The patient was advised to call back or seek an in-person evaluation if the symptoms worsen or if the condition fails to improve as anticipated.  I provided 48  minutes of non-face-to-face time during this encounter.   Judith Hansen, [facility]   Session Time: 9:05 am (48 minutes)  Participation Level: Active  Behavioral Response: Fairly GroomedAlertEuthymic  Type of Therapy: Individual Therapy  Treatment Goals addressed: STG: Destyni increase management of moods(depression/anxiety) AEB development of x 3 effective coping skills with ability to complete x 1 task daily within the next 90 days.    ProgressTowards Goals: Progressing  Interventions: Supportive  Summary: Judith Hansen is a 33 y.o. female who presents with dx of MDD and GAD. Presents alert and oriented; mood and affect adequate; stable. Jenita presents engaged and receptive to interventions. Thought process logical; clear. Notes continuing working to manage moods and reports for moods to be stable. 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 Shares would like to secure a new job but have difficulty in finding employment that is going to pay her what she is  worth. Notes desire to have child and shares concerns for parenting and motherhood. Notes relationship with father and comments he has made in regard to have heaving a child. Shares working to have work life balance and setting boundaries at work denying to answer calls from work she she is not working. Shares keeping iup with daily needed tasks. Progress with goals; sxs stable at this time.   Suicidal/Homicidal: Nowithout intent/plan  Therapist Response: Therapist engaged [place] in tele-therapy session.Assessed for current level of functioning, sxs management and level of stressors. Provided safe space for Judith Hansen to share thoughts and feelings related to sxs and current stressors. Provided support and encouragement; validated feelings. Encouraged to continue to explore job opportunities and explore desire to return to school for additional career opportunities. Encouraged ongoing balance in daily life and engaging in self-care and enjoyable activities. Reviewed session and provided follow up.   Plan: Return again in  x 5 weeks.  Diagnosis: MDD (major depressive disorder), recurrent episode, moderate (HCC)  Anxiety  Collaboration of Care: Other None  Patient/Guardian was advised Release of Information must be obtained prior to any record release in order to collaborate their care with an outside provider. Patient/Guardian was advised if they have not already done so to contact the registration department to sign all necessary forms in order for us  to release information regarding their care.   Consent: Patient/Guardian gives verbal consent for treatment and assignment of benefits for services provided during this visit. Patient/Guardian expressed understanding and agreed to proceed.  Judith Hansen, [place] 10/10/2023

## 2023-11-03 NOTE — Progress Notes (Unsigned)
 BH MD Outpatient Progress Note  11/05/2023 3:40 PM Judith Hansen  MRN:  [id number]  Assessment:  Judith Hansen presents for follow-up evaluation. Today, 11/05/23, patient reports continued stability of depression and anxiety although notes normative levels of irritability and anxiety in response to certain interpersonal stressors or things that are out of her control. She denies significant distress/dysfunction related to these symptoms. She attempted to start PNV but reports adverse effect of panic/derealization; she has stopped with resolution of these symptoms. Reviewed alternative options through [facility] and she will trial alternative brand/formulation. Encouraged to establish with PCP as well for further guidance and healthcare monitoring. Patient has attempted to decrease tobacco use without success thus far; reviewed options for NRT and she opted to start OTC nicorette gum to aid cessation. No other changes to plan of care at this time.   RTC in 3 months by video.  Identifying Information: Judith Hansen is a 33 y.o. female with a history of MDD and anxiety who is an established patient with [facility] participating in follow-up via video conferencing.   Plan:  # MDD, recurrent  Anxiety Past medication trials: unknown Status of problem: stable Interventions: -- Continue Cymbalta 60 mg nightly  -- Continue hydroxyzine 12.5-25 mg daily PRN anxiety/sleep (not currently requiring) -- Continue individual psychotherapy with Judith Hansen, Judith Hansen  # Prenatal counseling -- Patient reports she and husband have discussed they may try to conceive in near future; not currently using birth control or protection. -- Extensively reviewed risks/benefits of above medications in pregnancy specifically: -- Cymbalta: See note 03/31/23 for informed consent discussion.   -- Atarax: discussed recommendation that patient abstain from use of Atarax if actively trying to conceive given  limited data and contraindication that exists from animal studies.  -- START prenatal vitamin; patient reports adverse response to Natures Bounty PNV gummy (panic attack, derealization); encouraged to try alternative formulation -- Recommended discontinuing use of nicotine products and psychoeducation provided on risks.  -- Patient to trial OTC nicorette gum; declines patch -- Previously provided with MotherToBaby resource for further reading and included handouts on risks of continued vaping/nicotine use on pregnancy outcomes.  Patient was given contact information for behavioral health clinic and was instructed to call 911 for emergencies.   Subjective:  Chief Complaint:  Chief Complaint  Patient presents with   Medication Management    Interval History:   Reports she is doing "well" and has remained overall stable without much change from last visit. Denies feeling persistently down or depressed. Reports she continues to experience irritability in response to interpersonal stressors at work but feels this is normal response given the circumstances. Has episodes in which she feels anxious without clear reason but able to manage it fairly easily and does not interfere with functioning. On further reflection, feels it is mostly triggered by things that are out of her control. Not requiring Atarax PRN.  Sleeping fairly well; getting about 7-8 hours however when she has to open (occurs a few times weekly) has to wake up at [facility] and will then nap for 3-4 hours when she gets off from work. Appetite has been stable; trying to eat more balanced foods. Has not been exercising.   Started PNV but reports she had an episode of increased anxiety and derealization associated with it. She has since stopped with resolution of these symptoms. Is going to to be looking into alternative PNV options.   Continues to vape tobacco; identifies difficulty decreasing use. Reviewed options for NRT  and she opts to pick  up OTC nicorette gum.    Visit Diagnosis:    ICD-10-CM   1. Recurrent major depressive disorder, in full remission (HCC)  F33.42 DULoxetine (CYMBALTA) 60 MG capsule    2. Anxiety  F41.9 DULoxetine (CYMBALTA) 60 MG capsule      Past Psychiatric History:  Diagnoses: MDD, anxiety; historical diagnosis of ADHD Hospitalizations: x2 at 26 and 33 yo for SAs (below) Suicide attempts: x2 at 36 and 33 yo via cutting wrists SIB: denies Current access to guns: believes her husband has access but she does not Substance use:   -- Etoh: socially; once every 3 months  -- Tobacco: vapes throughout the day  -- Denies use of cannabis or illicit drugs  -- Caffeine: 1 red bull per day  Past Medical History:  Past Medical History:  Diagnosis Date   ADD (attention deficit disorder)    Anxiety    Asthma    Depression    Seasonal allergies     Past Surgical History:  Procedure Laterality Date   FRACTURE SURGERY  2004   rt arm has plate   ORIF FOREARM FRACTURE Right    WISDOM TOOTH EXTRACTION     Family Psychiatric History:  Mother: alcohol misuse, depression  Family History:  Family History  Problem Relation Age of Onset   Alcohol abuse Mother    Arthritis Mother    Depression Mother    Heart disease Maternal Grandfather     Social History:  Social History   Socioeconomic History   Marital status: Significant Other    Spouse name: Not on file   Number of children: Not on file   Years of education: Not on file   Highest education level: Not on file  Occupational History   Not on file  Tobacco Use   Smoking status: Former    Current packs/day: 0.00    Types: Cigarettes    Quit date: 12/02/2015    Years since quitting: 7.9   Smokeless tobacco: Never  Vaping Use   Vaping status: Every Day   Substances: Nicotine  Substance and Sexual Activity   Alcohol use: Yes    Comment: occasional; once every 3 months   Drug use: No   Sexual activity: Yes    Birth control/protection:  None  Other Topics Concern   Not on file  Social History Narrative   Entered 03/2014:   Works 2nd shift at [employer] with her mom   Social Drivers of Health   Financial Resource Strain: Medium Risk (09/23/2022)   Overall Financial Resource Strain (CARDIA)    Difficulty of Paying Living Expenses: Somewhat hard  Food Insecurity: No Food Insecurity (09/23/2022)   Hunger Vital Sign    Worried About Running Out of Food in the Last Year: Never true    Ran Out of Food in the Last Year: Never true  Transportation Needs: No Transportation Needs (09/23/2022)   PRAPARE - Administrator, Civil Service (Medical): No    Lack of Transportation (Non-Medical): No  Physical Activity: Inactive (09/23/2022)   Exercise Vital Sign    Days of Exercise per Week: 0 days    Minutes of Exercise per Session: 0 min  Stress: Stress Concern Present (09/23/2022)   [employer] of Occupational Health - Occupational Stress Questionnaire    Feeling of Stress : To some extent  Social Connections: Moderately Isolated (09/23/2022)   Social Connection and Isolation Panel [NHANES]  Frequency of Communication with Friends and Family: More than three times a week    Frequency of Social Gatherings with Friends and Family: Never    Attends Religious Services: Never    Database administrator or Organizations: No    Attends Engineer, structural: Never    Marital Status: Married    Allergies: No Known Allergies  Current Medications: Current Outpatient Medications  Medication Sig Dispense Refill   albuterol (PROVENTIL HFA;VENTOLIN HFA) 108 (90 Base) MCG/ACT inhaler Inhale 1-2 puffs into the lungs every 6 (six) hours as needed for wheezing or shortness of breath. 1 Inhaler 0   cetirizine (ZYRTEC) 10 MG tablet Take 10 mg by mouth daily.     DULoxetine (CYMBALTA) 60 MG capsule Take 1 capsule (60 mg total) by mouth at bedtime. 90 capsule 1   fluconazole (DIFLUCAN) 150 MG tablet Take 1 tablet  (150 mg total) by mouth daily. Take one tablet now and one in 3 days if you are still having symptoms 2 tablet 0   hydrOXYzine (ATARAX) 25 MG tablet Take 1 tablet (25 mg total) by mouth 3 (three) times daily. (Patient not taking: Reported on 11/05/2023) 90 tablet 2   loratadine (CLARITIN) 10 MG tablet Take 10 mg by mouth as needed for allergies.     Prenatal Vit-Fe Sulfate-FA-DHA (PRENATAL VITAMIN/MIN +DHA) 27-0.8-200 MG CAPS Take 1 capsule by mouth in the morning.     No current facility-administered medications for this visit.    ROS: Denies any physical complaints  Objective:  Psychiatric Specialty Exam: There were no vitals taken for this visit.There is no height or weight on file to calculate BMI.  General Appearance: Casual and Fairly Groomed  Eye Contact:  Good  Speech:  Clear and Coherent and Normal Rate  Volume:  Normal  Mood:   "the same"  Affect:   Euthymic; calm  Thought Content:  Denies AVH; paranoia; IOR    Suicidal Thoughts:  No  Homicidal Thoughts:  No  Thought Process:  Goal Directed and Linear  Orientation:  Full (Time, Place, and Person)    Memory:   Grossly intact  Judgment:  Good  Insight:  Good  Concentration:  Concentration: Good  Recall:  NA  Fund of Knowledge: Good  Language: Good  Psychomotor Activity:  Normal  Akathisia:  NA  AIMS (if indicated): not done  Assets:  Communication Skills Desire for Improvement Housing Intimacy Leisure Time Physical Health Social Support Talents/Skills Transportation Vocational/Educational  ADL's:  Intact  Cognition: WNL  Sleep:  Fair   PE: General: sits comfortably in view of camera; no acute distress  Pulm: no increased work of breathing on room air  MSK: all extremity movements appear intact  Neuro: no focal neurological deficits observed  Gait & Station: unable to assess by video    Metabolic Disorder Labs: No results found for: "HGBA1C", "MPG" No results found for: "PROLACTIN" No results found  for: "CHOL", "TRIG", "HDL", "CHOLHDL", "VLDL", "LDLCALC" No results found for: "TSH"  Therapeutic Level Labs: No results found for: "LITHIUM" No results found for: "VALPROATE" No results found for: "CBMZ"  Screenings:  AUDIT    Flowsheet Row Counselor from 09/23/2022 in [facility]  Alcohol Use Disorder Identification Test Final Score (AUDIT) 3      GAD-7    Flowsheet Row Counselor from 05/06/2023 in [facility] Video Visit from 10/30/2022 in [facility] Counselor from 09/23/2022 in [facility] Video Visit from 05/30/2022 in [facility] Video Visit from 02/26/2022 in [facility]  Total GAD-7 Score 9 10 8 3 8       [phone]    Flowsheet Row Counselor from 05/06/2023 in [facility] Video Visit from 10/30/2022 in [facility] Counselor from 09/23/2022 in [facility] Video Visit from 05/30/2022 in [facility] Video Visit from 02/26/2022 in [facility]  PHQ-2 Total Score 2 3 2 2 3   PHQ-9 Total Score 10 8 7 4 7       Flowsheet Row Video Visit from 10/30/2022 in [facility] Counselor from 09/23/2022 in [facility] ED from 01/04/2021 in [facility] RISK CATEGORY Low Risk Low Risk Error: Question 6 not populated       Collaboration of Care: Collaboration of Care: Medication Management AEB ongoing medication management and Psychiatrist AEB established with this provider  Patient/Guardian was advised Release of Information must be obtained prior to any record release in order to collaborate their care with an outside provider. Patient/Guardian was advised if they have not already done so to contact the  registration department to sign all necessary forms in order for [place] to release information regarding their care.   Consent: Patient/Guardian gives verbal consent for treatment and assignment of benefits for services provided during this visit. Patient/Guardian expressed understanding and agreed to proceed.   Televisit via video: I connected with patient on 11/05/23 at  3:00 PM EST by a video enabled telemedicine application and verified that I am speaking with the correct person using two identifiers.  Location: Patient: home address in [place] Provider: remote office in [place]   I discussed the limitations of evaluation and management by telemedicine and the availability of in person appointments. The patient expressed understanding and agreed to proceed.  I discussed the assessment and treatment plan with the patient. The patient was provided an opportunity to ask questions and all were answered. The patient agreed with the plan and demonstrated an understanding of the instructions.   The patient was advised to call back or seek an in-person evaluation if the symptoms worsen or if the condition fails to improve as anticipated.  I provided 25 minutes dedicated to the care of this patient via video on the date of this encounter to include chart review, face-to-face time with the patient, medication management/counseling.  Oakley Orban A Codee Tutson 11/05/2023, 3:40 PM

## 2023-11-05 ENCOUNTER — Encounter (HOSPITAL_COMMUNITY): Payer: Self-pay | Admitting: Psychiatry

## 2023-11-05 ENCOUNTER — Telehealth (HOSPITAL_COMMUNITY): Payer: No Payment, Other | Admitting: Psychiatry

## 2023-11-05 DIAGNOSIS — F419 Anxiety disorder, unspecified: Secondary | ICD-10-CM

## 2023-11-05 DIAGNOSIS — F3342 Major depressive disorder, recurrent, in full remission: Secondary | ICD-10-CM | POA: Diagnosis not present

## 2023-11-05 MED ORDER — DULOXETINE HCL 60 MG PO CPEP: 60.0000 mg | ORAL_CAPSULE | Freq: Every evening | ORAL | 1 refills | Status: DC

## 2023-11-05 NOTE — Patient Instructions (Signed)

## 2023-12-02 ENCOUNTER — Ambulatory Visit (HOSPITAL_COMMUNITY): Payer: No Payment, Other | Admitting: Mental Health

## 2024-01-05 ENCOUNTER — Ambulatory Visit (INDEPENDENT_AMBULATORY_CARE_PROVIDER_SITE_OTHER): Admitting: Mental Health

## 2024-01-05 ENCOUNTER — Encounter (HOSPITAL_COMMUNITY): Payer: Self-pay

## 2024-01-05 DIAGNOSIS — F3342 Major depressive disorder, recurrent, in full remission: Secondary | ICD-10-CM

## 2024-01-05 DIAGNOSIS — F419 Anxiety disorder, unspecified: Secondary | ICD-10-CM

## 2024-01-05 NOTE — Progress Notes (Signed)
   THERAPIST PROGRESS NOTE Virtual Visit via Video Note  I connected with Judith Hansen on 01/05/24 at  9:00 AM EDT by a video enabled telemedicine application and verified that I am speaking with the correct person using two identifiers.  Location: Patient: home address on file Provider: home office   I discussed the limitations of evaluation and management by telemedicine and the availability of in person appointments. The patient expressed understanding and agreed to proceed.  I discussed the assessment and treatment plan with the patient. The patient was provided an opportunity to ask questions and all were answered. The patient agreed with the plan and demonstrated an understanding of the instructions.   The patient was advised to call back or seek an in-person evaluation if the symptoms worsen or if the condition fails to improve as anticipated.  I provided 35 minutes of non-face-to-face time during this encounter.   Judith Hansen, [facility]   Session Time: 9:15 am ( 35 minutes)  Participation Level: Active  Behavioral Response: CasualAlertEuthymic  Type of Therapy: Individual Therapy  Treatment Goals addressed:  STG: "Daily stressors." Judith Hansen will maintain stability in moods AEB ability to process stressors in balance manner with no distortions in thought process with engagement in enjoyable activities weekly within the next 90 days.   ProgressTowards Goals: Progressing  Interventions: Supportive  Summary: Judith Hansen is a 33 y.o. female who presents with dx of MDD and GAD. Presents alert and oriented; mood and affect adequate; stable. Judith Hansen presents engaged and receptive to interventions. Thought process logical; clear. Notes continuing working to manage moods and reports for moods to be stable, "even keeled.". Shares ongoing chief complaint of stress related to work and shares thoughts on working to gain new employment. Shares recently being able to have company  over her home for [employer] day and reports to have enjoyed this. Shares thoughts on desire to have child and relationship with husband and his hx of drinking behaviors and her childhood. Shares working to engage in Pharmacologist and balanced days with reading, watching TV and spending time with friends. Shares thoughts on having new employment will support in additional decrease in stress. Agrees to updated treatment plan, denies safety concerns.   Suicidal/Homicidal: Nowithout intent/plan  Therapist Response:  Therapist engaged [place] in tele-therapy session.Assessed for current level of functioning, sxs management and level of stressors. Provided safe space for [name] to share thoughts and feelings related to sxs and work environment. Supported in processing thoughts on obtaining new employment and benefits to mood. Supported in processing thoughts and improvement in relationship and moving forward in decision to conceive. Explored balance I daily life and ability toengage I thing sin which she enjoys. Explored factors contributing to stability in moods. Reviewed session, updated treatment plan.   Plan: Return again in  x 6 weeks.  Diagnosis: Recurrent major depressive disorder, in full remission (HCC)  Anxiety  Collaboration of Care: Other None  Patient/Guardian was advised Release of Information must be obtained prior to any record release in order to collaborate their care with an outside provider. Patient/Guardian was advised if they have not already done so to contact the registration department to sign all necessary forms in order for us  to release information regarding their care.   Consent: Patient/Guardian gives verbal consent for treatment and assignment of benefits for services provided during this visit. Patient/Guardian expressed understanding and agreed to proceed.   Judith Hansen [place], [facility] 01/05/2024

## 2024-02-02 NOTE — Progress Notes (Unsigned)
 BH MD Outpatient Progress Note  02/04/2024 2:36 PM Judith Hansen  MRN:  [id number]  Assessment:  Judith Hansen presents for follow-up evaluation. Today, 02/04/24, patient reports overall psychiatric stability although continues to experience episodes of irritability in face of interpersonal interactions or around menstrual cycle. She denies significant distress/dysfunction related to these symptoms. She reports episodes of oversleeping along with loud snoring and possible apneic episodes; as she will be obtaining private insurance through husband's work in near future will wait to place referral for sleep study until this is in place. Additionally, patient reports ongoing nicotine use and have extensively reviewed risks especially in light of patient's goal for family planning soon. She is amenable to starting Chantix pending insurance. Reminded patient of importance of starting PNV.   RTC in 3 months by video.  Identifying Information: Judith Hansen is a 33 y.o. female with a history of MDD and anxiety who is an established patient with [facility] participating in follow-up via video conferencing.   Plan:  # MDD, recurrent  Anxiety Past medication trials: unknown Status of problem: stable Interventions: -- Continue Cymbalta  60 mg nightly  -- Continue individual psychotherapy with Carmel Chimes, [facility]  # Fatigue  Reported snoring and apneic episodes Status of problem: new problem to this provider Interventions: -- High suspicion for OSA given reported loud snoring, apneic episodes, and morning headaches; will place referral for sleep study once patient obtains insurance in near future  # Prenatal counseling -- Patient reports she and husband have discussed they may try to conceive in near future; not currently using birth control or protection. -- Extensively reviewed risks/benefits of above medications in pregnancy specifically: -- Cymbalta : See note 03/31/23  for informed consent discussion.  -- Reminded patient to start prenatal vitamin; patient reports adverse response to Natures Bounty PNV gummy (panic attack, derealization); encouraged to try alternative formulation -- Recommended discontinuing use of nicotine products and psychoeducation provided on risks.  -- Patient reports inability to tolerate OTC nicorette gum or patch  -- Will consider Chantix once patient obtains insurance in near future -- Previously provided with MotherToBaby resource for further reading and included handouts on risks of continued vaping/nicotine use on pregnancy outcomes.  Patient was given contact information for behavioral health clinic and was instructed to call 911 for emergencies.   Subjective:  Chief Complaint:  Chief Complaint  Patient presents with   Medication Management    Interval History:   Patient reports she is doing "pretty good" - shares that she got her tragus pierced and has actually experienced improvement in anxiety and panic attacks with tragus piercing. Anxiety and irritability waxes and wanes, often triggered by work and menstrual cycle. Not struggling with cramps as much as before.   Denies passive/active SI. Sleeping "alright" but notes she may spend too much time in bed after work shifts and may sleep up to 14 hours. Typically feels well rested when she wakes up; takes occasional naps. Reports she has been told she snores and endorses occasional apneic episodes, morning headaches. Has never been worked up for sleep apnea.   Does not have insurance but husband recently got a new job and she will be getting new insurance. Open to referral to sleep study once she obtains new insurance.   Not actively TTC however likely plans to in the next year. Not currently taking PNV and encouraged to do so if having unprotected sex. Continues to smoke throughout the day. Reports gum gave her heartburn. Would be open  to Chantix once insurance is in place.    Amenable to continuing Cymbalta  as prescribed.   Visit Diagnosis:    ICD-10-CM   1. Recurrent major depressive disorder, in full remission (HCC)  F33.42 DULoxetine  (CYMBALTA ) 60 MG capsule    2. Anxiety  F41.9 DULoxetine  (CYMBALTA ) 60 MG capsule      Past Psychiatric History:  Diagnoses: MDD, anxiety; historical diagnosis of ADHD Hospitalizations: x2 at 11 and 33 yo for SAs (below) Suicide attempts: x2 at 17 and 33 yo via cutting wrists SIB: denies Current access to guns: believes her husband has access but she does not Substance use:   -- Etoh: socially; once every 3 months  -- Tobacco: vapes throughout the day  -- Denies use of cannabis or illicit drugs  -- Caffeine: 1 red bull per day  Past Medical History:  Past Medical History:  Diagnosis Date   ADD (attention deficit disorder)    Anxiety    Asthma    Depression    Seasonal allergies     Past Surgical History:  Procedure Laterality Date   FRACTURE SURGERY  2004   rt arm has plate   ORIF FOREARM FRACTURE Right    WISDOM TOOTH EXTRACTION     Family Psychiatric History:  Mother: alcohol misuse, depression  Family History:  Family History  Problem Relation Age of Onset   Alcohol abuse Mother    Arthritis Mother    Depression Mother    Heart disease Maternal Grandfather     Social History:  Social History   Socioeconomic History   Marital status: Significant Other    Spouse name: Not on file   Number of children: Not on file   Years of education: Not on file   Highest education level: Not on file  Occupational History   Not on file  Tobacco Use   Smoking status: Former    Current packs/day: 0.00    Types: Cigarettes    Quit date: 12/02/2015    Years since quitting: 8.1   Smokeless tobacco: Never  Vaping Use   Vaping status: Every Day   Substances: Nicotine  Substance and Sexual Activity   Alcohol use: Yes    Comment: occasional; once every 3 months   Drug use: No   Sexual activity: Yes     Birth control/protection: None  Other Topics Concern   Not on file  Social History Narrative   Entered 03/2014:   Works 2nd shift at [employer] with her mom   Social Drivers of Health   Financial Resource Strain: Medium Risk (09/23/2022)   Overall Financial Resource Strain (CARDIA)    Difficulty of Paying Living Expenses: Somewhat hard  Food Insecurity: No Food Insecurity (09/23/2022)   Hunger Vital Sign    Worried About Running Out of Food in the Last Year: Never true    Ran Out of Food in the Last Year: Never true  Transportation Needs: No Transportation Needs (09/23/2022)   PRAPARE - Administrator, Civil Service (Medical): No    Lack of Transportation (Non-Medical): No  Physical Activity: Inactive (09/23/2022)   Exercise Vital Sign    Days of Exercise per Week: 0 days    Minutes of Exercise per Session: 0 min  Stress: Stress Concern Present (09/23/2022)   [employer] of Occupational Health - Occupational Stress Questionnaire    Feeling of Stress : To some extent  Social Connections: Moderately Isolated (09/23/2022)   Social Connection and Isolation  Panel [NHANES]    Frequency of Communication with Friends and Family: More than three times a week    Frequency of Social Gatherings with Friends and Family: Never    Attends Religious Services: Never    Database administrator or Organizations: No    Attends Engineer, structural: Never    Marital Status: Married    Allergies: No Known Allergies  Current Medications: Current Outpatient Medications  Medication Sig Dispense Refill   albuterol  (PROVENTIL  HFA;VENTOLIN  HFA) 108 (90 Base) MCG/ACT inhaler Inhale 1-2 puffs into the lungs every 6 (six) hours as needed for wheezing or shortness of breath. 1 Inhaler 0   cetirizine (ZYRTEC) 10 MG tablet Take 10 mg by mouth daily.     DULoxetine  (CYMBALTA ) 60 MG capsule Take 1 capsule (60 mg total) by mouth at bedtime. 90 capsule 1   fluconazole  (DIFLUCAN )  150 MG tablet Take 1 tablet (150 mg total) by mouth daily. Take one tablet now and one in 3 days if you are still having symptoms 2 tablet 0   loratadine (CLARITIN) 10 MG tablet Take 10 mg by mouth as needed for allergies.     Prenatal Vit-Fe Sulfate-FA-DHA (PRENATAL VITAMIN/MIN +DHA) 27-0.8-200 MG CAPS Take 1 capsule by mouth in the morning. (Patient not taking: Reported on 02/04/2024)     No current facility-administered medications for this visit.    ROS: See above  Objective:  Psychiatric Specialty Exam: There were no vitals taken for this visit.There is no height or weight on file to calculate BMI.  General Appearance: Casual and Fairly Groomed  Eye Contact:  Good  Speech:  Clear and Coherent and Normal Rate  Volume:  Normal  Mood:  "pretty good"  Affect:  Euthymic; calm  Thought Content: Denies AVH; paranoia; IOR   Suicidal Thoughts:  No  Homicidal Thoughts:  No  Thought Process:  Goal Directed and Linear  Orientation:  Full (Time, Place, and Person)    Memory:  Grossly intact  Judgment:  Good  Insight:  Good  Concentration:  Concentration: Good  Recall:  NA  Fund of Knowledge: Good  Language: Good  Psychomotor Activity:  Normal  Akathisia:  NA  AIMS (if indicated): not done  Assets:  Communication Skills Desire for Improvement Housing Intimacy Leisure Time Physical Health Social Support Talents/Skills Transportation Vocational/Educational  ADL's:  Intact  Cognition: WNL  Sleep:  Fair - reports episodes of oversleeping   PE: General: sits comfortably in view of camera; no acute distress  Pulm: no increased work of breathing on room air  MSK: all extremity movements appear intact  Neuro: no focal neurological deficits observed  Gait & Station: unable to assess by video    Metabolic Disorder Labs: No results found for: "HGBA1C", "MPG" No results found for: "PROLACTIN" No results found for: "CHOL", "TRIG", "HDL", "CHOLHDL", "VLDL", "LDLCALC" No results  found for: "TSH"  Therapeutic Level Labs: No results found for: "LITHIUM" No results found for: "VALPROATE" No results found for: "CBMZ"  Screenings:  AUDIT    Flowsheet Row Counselor from 09/23/2022 in [facility]  Alcohol Use Disorder Identification Test Final Score (AUDIT) 3      GAD-7    Flowsheet Row Counselor from 05/06/2023 in [facility] Video Visit from 10/30/2022 in [facility] Counselor from 09/23/2022 in [facility] Video Visit from 05/30/2022 in [facility] Video Visit from 02/26/2022 in [facility]  Total GAD-7 Score 9 10 8 3 8       PHQ2-9    Flowsheet Row Counselor from 05/06/2023 in [facility] Video Visit from 10/30/2022 in [facility] Counselor from 09/23/2022 in [facility] Video Visit from 05/30/2022 in [facility] Video Visit from 02/26/2022 in [facility]  PHQ-2 Total Score 2 3 2 2 3   PHQ-9 Total Score 10 8 7 4 7       Flowsheet Row Video Visit from 10/30/2022 in [facility] Counselor from 09/23/2022 in [facility] UC from 01/04/2021 in [facility] RISK CATEGORY Low Risk Low Risk Error: Question 6 not populated       Collaboration of Care: Collaboration of Care: Medication Management AEB ongoing medication management and Psychiatrist AEB established with this provider  Patient/Guardian was advised Release of Information must be obtained prior to any record release in order to collaborate their care with an outside provider. Patient/Guardian was advised if they have not already done so to contact the registration department to sign all necessary forms in order for us  to  release information regarding their care.   Consent: Patient/Guardian gives verbal consent for treatment and assignment of benefits for services provided during this visit. Patient/Guardian expressed understanding and agreed to proceed.   Televisit via video: I connected with patient on 02/04/24 at 11:00 AM EDT by a video enabled telemedicine application and verified that I am speaking with the correct person using two identifiers.  Location: Patient: home address in [place] Provider: remote office in [place]   I discussed the limitations of evaluation and management by telemedicine and the availability of in person appointments. The patient expressed understanding and agreed to proceed.  I discussed the assessment and treatment plan with the patient. The patient was provided an opportunity to ask questions and all were answered. The patient agreed with the plan and demonstrated an understanding of the instructions.   The patient was advised to call back or seek an in-person evaluation if the symptoms worsen or if the condition fails to improve as anticipated.  I provided 25 minutes dedicated to the care of this patient via video on the date of this encounter to include chart review, face-to-face time with the patient, medication management/counseling.  Saahas Hidrogo A Ricke Kimoto 02/04/2024, 2:36 PM

## 2024-02-04 ENCOUNTER — Telehealth (HOSPITAL_COMMUNITY): Admitting: Psychiatry

## 2024-02-04 ENCOUNTER — Encounter (HOSPITAL_COMMUNITY): Payer: Self-pay | Admitting: Psychiatry

## 2024-02-04 DIAGNOSIS — F3342 Major depressive disorder, recurrent, in full remission: Secondary | ICD-10-CM

## 2024-02-04 DIAGNOSIS — F419 Anxiety disorder, unspecified: Secondary | ICD-10-CM

## 2024-02-04 MED ORDER — DULOXETINE HCL 60 MG PO CPEP: 60.0000 mg | ORAL_CAPSULE | Freq: Every evening | ORAL | 1 refills | Status: DC

## 2024-02-04 NOTE — Patient Instructions (Signed)

## 2024-03-02 ENCOUNTER — Ambulatory Visit (HOSPITAL_COMMUNITY): Admitting: Mental Health

## 2024-04-26 ENCOUNTER — Ambulatory Visit (INDEPENDENT_AMBULATORY_CARE_PROVIDER_SITE_OTHER): Admitting: Mental Health

## 2024-04-26 DIAGNOSIS — F3342 Major depressive disorder, recurrent, in full remission: Secondary | ICD-10-CM

## 2024-04-26 DIAGNOSIS — F419 Anxiety disorder, unspecified: Secondary | ICD-10-CM

## 2024-04-26 NOTE — Progress Notes (Signed)
   THERAPIST PROGRESS NOTE Virtual Visit via Video Note  I connected with Judith Hansen on 04/26/24 at  9:00 AM EDT by a video enabled telemedicine application and verified that I am speaking with the correct person using two identifiers.  Location: Patient: home address on file Provider: home office   I discussed the limitations of evaluation and management by telemedicine and the availability of in person appointments. The patient expressed understanding and agreed to proceed.  I discussed the assessment and treatment plan with the patient. The patient was provided an opportunity to ask questions and all were answered. The patient agreed with the plan and demonstrated an understanding of the instructions.   The patient was advised to call back or seek an in-person evaluation if the symptoms worsen or if the condition fails to improve as anticipated.  I provided 53 minutes of non-face-to-face time during this encounter.   Judith Hansen, [facility]   Session Time: 9:03 am ( 53 minutes)  Participation Level: Active  Behavioral Response: CasualAlertEuthymic  Type of Therapy: Individual Therapy  Treatment Goals addressed:   STG: Daily stressors. Judith Hansen will maintain stability in moods AEB ability to process stressors in balance manner with no distortions in thought process with engagement in enjoyable activities weekly within the next 90 days.   ProgressTowards Goals: Progressing  Interventions: Solution-focused, Supportive  Summary:Judith Hansen is a 33 y.o. female who presents with dx of MDD and GAD. Presents alert and oriented; mood and affect adequate; stable. Judith Hansen presents engaged and receptive to interventions. Thought process logical; clear. Notes continuing working to manage moods and reports for moods to be stable, with some current stressors related to household needs. Shares concerns for marriage and wanting increased support around the house; explores with  therapist ability to communicate and problem solve to gain needed support. Notes stressors of current employment. Shares thoughts on desire et have baby but concerned for husband's health and not wanting to be a ' married single mother.' Shares upcoming plans she is looking forward to with family and friends. Shares thoughts on maintaining balance and engagement in enjoyable activities weekly. Ongoing progress with moods. Denies safety concerns.   Suicidal/Homicidal: Nowithout intent/plan  Therapist Response: Therapist engaged Judith Hansen in tele-therapy session.Assessed for current level of functioning, sxs management and level of stressors. Provided safe space for Judith Hansen to share thoughts and feelings related to sxs and work and home environment. Provided support and encouragement; validated feelings. Engaged in solution building to support in gaining needed support from husband and encouraged ongoing clear communication and areas of needed support. Assessed for ability to engage in coping skills as needed and engagement in self-care. Reviewed session and provided follow up.  Plan: Return again in  x 6 weeks.  Diagnosis: Recurrent major depressive disorder, in full remission (HCC)  Anxiety  Collaboration of Care: Other None  Patient/Guardian was advised Release of Information must be obtained prior to any record release in order to collaborate their care with an outside provider. Patient/Guardian was advised if they have not already done so to contact the registration department to sign all necessary forms in order for us  to release information regarding their care.   Consent: Patient/Guardian gives verbal consent for treatment and assignment of benefits for services provided during this visit. Patient/Guardian expressed understanding and agreed to proceed.   Judith Hansen, [facility] 04/26/2024

## 2024-04-29 NOTE — Progress Notes (Signed)
 BH MD Outpatient Progress Note  05/05/2024 11:25 AM Judith Hansen  MRN:  [id number]  Assessment:  Judith Hansen presents for follow-up evaluation. Today, 05/05/24, patient reports overall psychiatric stability outside of 2 panic attacks this interval - one with unclear trigger and the other precipitated by overwhelm 2/2 growing list of tasks to be completed. She continues to work in therapy on making tasks more manageable. Overall, she feels that panic attacks have been less frequent and intense compared to prior. While she reports fleeting moments of depressed mood with passive SI, she denies persistence of these symptoms or active SI. No changes to plan of care at this time.  RTC in 3 months by video. Patient was made aware of this provider's departure from [facility] at the end of Nov 2025 and that she will be transitioned to alternative provider in the clinic after this time. All questions/concerns addressed.  Identifying Information: Judith Hansen is a 33 y.o. female with a history of MDD and anxiety who is an established patient with [facility] participating in follow-up via video conferencing.   Plan:  # MDD, recurrent  Anxiety Past medication trials: unknown Status of problem: stable Interventions: -- Continue Cymbalta  60 mg nightly  -- Continue individual psychotherapy with Judith Hansen, [facility]  # Fatigue  Reported snoring and apneic episodes Status of problem: new problem to this provider Interventions: -- High suspicion for OSA given reported loud snoring, apneic episodes, and morning headaches; will place referral for sleep study once patient obtains insurance in near future  # Prenatal counseling -- Patient reports she and husband have discussed they may try to conceive in near future; not currently using birth control or protection. -- Extensively reviewed risks/benefits of above medications in pregnancy specifically: -- Cymbalta : See note  03/31/23 for informed consent discussion.  -- Previously rminded patient to start prenatal vitamin; patient reports adverse response to Natures Bounty PNV gummy (panic attack, derealization); encouraged to try alternative formulation -- Recommended discontinuing use of nicotine products and psychoeducation provided on risks.  -- Patient reports inability to tolerate OTC nicorette gum or patch  -- Will consider Chantix once patient obtains insurance in near future -- Previously provided with MotherToBaby resource for further reading and included handouts on risks of continued vaping/nicotine use on pregnancy outcomes.  Patient was given contact information for behavioral health clinic and was instructed to call 911 for emergencies.   Subjective:  Chief Complaint:  Chief Complaint  Patient presents with   Medication Management    Interval History:   Judith Hansen reports she is doing okay and steady and continuing to take things day by day. Notes that July was a more difficult month due to A/C going out and refrigerator breaking. Reports 2 panic attacks this interval. Identifies that at time of last panic attack, was feeling overwhelmed by thinking about number of tasks waiting for her. Has been working in therapy on making tasks more manageable. Reports energy drink use - about 1 energy drink a day at times as late as 3PM. Estimates that overall panic attacks have been more manageable, about one every few months and are much less intense from prior.   Reports some moments of depressed mood and overwhelm/hopelessness but this is fleeting. Reports occasional passive SI during states of acute overwhelm/self critical thoughts but denies active SI.   Sleeping well at night; reports making healthier food choices recently. Has stopped vaping and now smoking cigarettes. Reports overall she has reduced nicotine consumption and has  set goal to pursue continued gradual reduction.  No change in insurance;  will be obtaining private insurance in near future but not sure of date.  Amenable to continuing Cymbalta  as prescribed.  Visit Diagnosis:    ICD-10-CM   1. Recurrent major depressive disorder, in full remission (HCC)  F33.42     2. Anxiety  F41.9        Past Psychiatric History:  Diagnoses: MDD, anxiety; historical diagnosis of ADHD Hospitalizations: x2 at 50 and 33 yo for SAs (below) Suicide attempts: x2 at 67 and 33 yo via cutting wrists SIB: denies Current access to guns: believes her husband has access but she does not Substance use:   -- Etoh: socially; once every 3 months  -- Tobacco: 1/2 ppd; previously vaping  -- Denies use of cannabis or illicit drugs  -- Caffeine: 1 red bull per day; occasional soda and green tea; denies regular consumption of coffee  Past Medical History:  Past Medical History:  Diagnosis Date   ADD (attention deficit disorder)    Anxiety    Asthma    Depression    Seasonal allergies     Past Surgical History:  Procedure Laterality Date   FRACTURE SURGERY  2004   rt arm has plate   ORIF FOREARM FRACTURE Right    WISDOM TOOTH EXTRACTION     Family Psychiatric History:  Mother: alcohol misuse, depression  Family History:  Family History  Problem Relation Age of Onset   Alcohol abuse Mother    Arthritis Mother    Depression Mother    Heart disease Maternal Grandfather     Social History:  Social History   Socioeconomic History   Marital status: Significant Other    Spouse name: Not on file   Number of children: Not on file   Years of education: Not on file   Highest education level: Not on file  Occupational History   Not on file  Tobacco Use   Smoking status: Every Day    Current packs/day: 0.50    Average packs/day: 0.5 packs/day for 0.2 years (0.1 ttl pk-yrs)    Types: Cigarettes    Start date: 03/02/2024    Last attempt to quit: 12/02/2015   Smokeless tobacco: Never  Vaping Use   Vaping status: Former   Substances:  Nicotine  Substance and Sexual Activity   Alcohol use: Yes    Comment: occasional; once every 3 months   Drug use: No   Sexual activity: Yes    Birth control/protection: None  Other Topics Concern   Not on file  Social History Narrative   Entered 03/2014:   Works 2nd shift at [employer] with her mom   Social Drivers of Health   Financial Resource Strain: Medium Risk (09/23/2022)   Overall Financial Resource Strain (CARDIA)    Difficulty of Paying Living Expenses: Somewhat hard  Food Insecurity: No Food Insecurity (09/23/2022)   Hunger Vital Sign    Worried About Running Out of Food in the Last Year: Never true    Ran Out of Food in the Last Year: Never true  Transportation Needs: No Transportation Needs (09/23/2022)   PRAPARE - Administrator, Civil Service (Medical): No    Lack of Transportation (Non-Medical): No  Physical Activity: Inactive (09/23/2022)   Exercise Vital Sign    Days of Exercise per Week: 0 days    Minutes of Exercise per Session: 0 min  Stress: Stress Concern Present (09/23/2022)  [employer] - Occupational Stress Questionnaire    Feeling of Stress : To some extent  Social Connections: Moderately Isolated (09/23/2022)   Social Connection and Isolation Panel    Frequency of Communication with Friends and Family: More than three times a week    Frequency of Social Gatherings with Friends and Family: Never    Attends Religious Services: Never    Database administrator or Organizations: No    Attends Engineer, structural: Never    Marital Status: Married    Allergies: No Known Allergies  Current Medications: Current Outpatient Medications  Medication Sig Dispense Refill   DULoxetine  (CYMBALTA ) 60 MG capsule Take 1 capsule (60 mg total) by mouth at bedtime. 90 capsule 1   albuterol  (PROVENTIL  HFA;VENTOLIN  HFA) 108 (90 Base) MCG/ACT inhaler Inhale 1-2 puffs into the lungs every 6 (six) hours as needed  for wheezing or shortness of breath. 1 Inhaler 0   cetirizine (ZYRTEC) 10 MG tablet Take 10 mg by mouth daily.     fluconazole  (DIFLUCAN ) 150 MG tablet Take 1 tablet (150 mg total) by mouth daily. Take one tablet now and one in 3 days if you are still having symptoms 2 tablet 0   loratadine (CLARITIN) 10 MG tablet Take 10 mg by mouth as needed for allergies.     Prenatal Vit-Fe Sulfate-FA-DHA (PRENATAL VITAMIN/MIN +DHA) 27-0.8-200 MG CAPS Take 1 capsule by mouth in the morning. (Patient not taking: Reported on 02/04/2024)     No current facility-administered medications for this visit.    ROS: See above  Objective:  Psychiatric Specialty Exam: There were no vitals taken for this visit.There is no height or weight on file to calculate BMI.  General Appearance: Casual and Fairly Groomed  Eye Contact:  Good  Speech:  Clear and Coherent and Normal Rate  Volume:  Normal  Mood:  steady  Affect:  Euthymic; calm  Thought Content: Denies AVH; paranoia; IOR   Suicidal Thoughts:  No  Homicidal Thoughts:  No  Thought Process:  Goal Directed and Linear  Orientation:  Full (Time, Place, and Person)    Memory:  Grossly intact  Judgment:  Good  Insight:  Good  Concentration:  Concentration: Good  Recall:  NA  Fund of Knowledge: Good  Language: Good  Psychomotor Activity:  Normal  Akathisia:  NA  AIMS (if indicated): not done  Assets:  Communication Skills Desire for Improvement Housing Intimacy Leisure Time Physical Health Social Support Talents/Skills Transportation Vocational/Educational  ADL's:  Intact  Cognition: WNL  Sleep:  Good   PE: General: sits comfortably in view of camera; no acute distress  Pulm: no increased work of breathing on room air  MSK: all extremity movements appear intact  Neuro: no focal neurological deficits observed  Gait & Station: unable to assess by video    Metabolic Disorder Labs: No results found for: HGBA1C, MPG No results found for:  PROLACTIN No results found for: CHOL, TRIG, HDL, CHOLHDL, VLDL, LDLCALC No results found for: TSH  Therapeutic Level Labs: No results found for: LITHIUM No results found for: VALPROATE No results found for: CBMZ  Screenings:  AUDIT    Advertising copywriter from 09/23/2022 in [facility]  Alcohol Use Disorder Identification Test Final Score (AUDIT) 3   GAD-7    Flowsheet Row Counselor from 05/06/2023 in [facility] Video Visit from 10/30/2022 in [facility] Counselor from 09/23/2022 in [facility] Video Visit from 05/30/2022 in [facility] Video Visit from 02/26/2022 in [facility]  Total GAD-7 Score 9 10 8 3 8    PHQ2-9    Flowsheet Row Counselor from 05/06/2023 in [facility] Video Visit from 10/30/2022 in [facility] Counselor from 09/23/2022 in [facility] Video Visit from 05/30/2022 in [facility] Video Visit from 02/26/2022 in [facility]  PHQ-2 Total Score 2 3 2 2 3   PHQ-9 Total Score 10 8 7 4 7    Flowsheet Row Video Visit from 10/30/2022 in [facility] Counselor from 09/23/2022 in [facility] UC from 01/04/2021 in [facility] RISK CATEGORY Low Risk Low Risk Error: Question 6 not populated    Collaboration of Care: Collaboration of Care: Medication Management AEB ongoing medication management and Psychiatrist AEB established with this provider  Patient/Guardian was advised Release of Information must be obtained prior to any record release in order to collaborate their care with an outside provider. Patient/Guardian was advised if they have not already done so to  contact the registration department to sign all necessary forms in order for us  to release information regarding their care.   Consent: Patient/Guardian gives verbal consent for treatment and assignment of benefits for services provided during this visit. Patient/Guardian expressed understanding and agreed to proceed.   Televisit via video: I connected with patient on 05/05/24 at 11:00 AM EDT by a video enabled telemedicine application and verified that I am speaking with the correct person using two identifiers.  Location: Patient: private location in workplace in [place] Provider: remote office in [place]   I discussed the limitations of evaluation and management by telemedicine and the availability of in person appointments. The patient expressed understanding and agreed to proceed.  I discussed the assessment and treatment plan with the patient. The patient was provided an opportunity to ask questions and all were answered. The patient agreed with the plan and demonstrated an understanding of the instructions.   The patient was advised to call back or seek an in-person evaluation if the symptoms worsen or if the condition fails to improve as anticipated.  I provided 30 minutes dedicated to the care of this patient via video on the date of this encounter to include chart review, face-to-face time with the patient, medication management/counseling.  Yvonna Brun A Arnetia Bronk 05/05/2024, 11:25 AM

## 2024-05-05 ENCOUNTER — Telehealth (INDEPENDENT_AMBULATORY_CARE_PROVIDER_SITE_OTHER): Payer: Self-pay | Admitting: Psychiatry

## 2024-05-05 ENCOUNTER — Encounter (HOSPITAL_COMMUNITY): Payer: Self-pay | Admitting: Psychiatry

## 2024-05-05 DIAGNOSIS — F419 Anxiety disorder, unspecified: Secondary | ICD-10-CM

## 2024-05-05 DIAGNOSIS — F3342 Major depressive disorder, recurrent, in full remission: Secondary | ICD-10-CM

## 2024-05-05 MED ORDER — DULOXETINE HCL 60 MG PO CPEP: 60.0000 mg | ORAL_CAPSULE | Freq: Every evening | ORAL | 1 refills | Status: DC

## 2024-05-05 NOTE — Patient Instructions (Signed)

## 2024-06-08 ENCOUNTER — Ambulatory Visit (HOSPITAL_COMMUNITY): Payer: Self-pay

## 2024-06-10 ENCOUNTER — Ambulatory Visit (HOSPITAL_COMMUNITY): Admitting: Mental Health

## 2024-06-10 DIAGNOSIS — F419 Anxiety disorder, unspecified: Secondary | ICD-10-CM

## 2024-06-10 DIAGNOSIS — F3342 Major depressive disorder, recurrent, in full remission: Secondary | ICD-10-CM

## 2024-06-10 NOTE — Progress Notes (Signed)
   THERAPIST PROGRESS NOTE Virtual Visit via Video Note  I connected with Judith Hansen on 06/10/24 at 11:00 AM EDT by a video enabled telemedicine application and verified that I am speaking with the correct person using two identifiers.  Location: Patient: home address on file Provider: office   I discussed the limitations of evaluation and management by telemedicine and the availability of in person appointments. The patient expressed understanding and agreed to proceed.  I discussed the assessment and treatment plan with the patient. The patient was provided an opportunity to ask questions and all were answered. The patient agreed with the plan and demonstrated an understanding of the instructions.   The patient was advised to call back or seek an in-person evaluation if the symptoms worsen or if the condition fails to improve as anticipated.  I provided 43 minutes of non-face-to-face time during this encounter.   Judith Hansen, Judith Hansen   Session Time: 11:07 am (   Participation Level: Active  Behavioral Response: CasualAlertIrritable  Type of Therapy: Individual Therapy  Treatment Goals addressed:   STG: Daily stressors. Judith Hansen will maintain stability in moods AEB ability to process stressors in balance manner with no distortions in thought process with engagement in enjoyable activities weekly within the next 90 days.   ProgressTowards Goals: Progressing  Interventions: Supportive  Summary:Judith Hansen is a 33 y.o. female who presents with dx of MDD and GAD. Presents alert and oriented; mood and affect adequate; stable. Judith Hansen presents engaged and receptive to interventions. Thought process logical; clear. Notes ongoing frustration and stressors with work noting moods, not all that great.  Shares recent events that have taken place and thoughts of not feeling valued at work. Shares plans to explore additional work and explores change of type of work in which  she engages in. Notes thoughts on having a child and associates concerns and stressors related. Shares thoughts on husband and awareness of his ability to provide additional support. Denies engagement in self-care and agrees to work to re-establish balance and engagement in enjoyable activities. Denies safety concerns. Some regression in goals.    Suicidal/Homicidal: Nowithout intent/plan  Therapist Response: Therapist engaged Judith Hansen in tele-therapy session.Assessed for current level of functioning, sxs management and level of stressors. Provided safe space for Judith Hansen to share thoughts and feelings related to sxs and work and home. Explored use of stress management skills, relaxation and enjoyable activities. Supported in processing concerns for buildings a family and encouraged ongoing communication with husband. Encouraged engagement in self-care and exploration of job opportunities. Reviewed session and provided follow up.   Plan: Return again in  x 4 weeks.  Diagnosis: Anxiety  Recurrent major depressive disorder, in full remission  Collaboration of Care: Other None  Patient/Guardian was advised Release of Information must be obtained prior to any record release in order to collaborate their care with an outside provider. Patient/Guardian was advised if they have not already done so to contact the registration department to sign all necessary forms in order for us  to release information regarding their care.   Consent: Patient/Guardian gives verbal consent for treatment and assignment of benefits for services provided during this visit. Patient/Guardian expressed understanding and agreed to proceed.   Judith Hansen, Judith Hansen 06/10/2024

## 2024-06-11 ENCOUNTER — Encounter (HOSPITAL_COMMUNITY): Payer: Self-pay

## 2024-06-11 ENCOUNTER — Ambulatory Visit (HOSPITAL_COMMUNITY)
Admission: RE | Admit: 2024-06-11 | Discharge: 2024-06-11 | Disposition: A | Discharge status: Home / Self Care | Payer: Self-pay | Source: Ambulatory Visit

## 2024-06-11 VITALS — BP 121/80 | HR 81 | Temp 98.4°F | Resp 18 | Wt 250.0 lb

## 2024-06-11 DIAGNOSIS — R300 Dysuria: Secondary | ICD-10-CM | POA: Insufficient documentation

## 2024-06-11 LAB — POCT URINALYSIS DIP (MANUAL ENTRY)
Bilirubin, UA: NEGATIVE
Blood, UA: NEGATIVE
Glucose, UA: NEGATIVE mg/dL
Ketones, POC UA: NEGATIVE mg/dL
Leukocytes, UA: NEGATIVE
Nitrite, UA: NEGATIVE
Protein Ur, POC: NEGATIVE mg/dL
Spec Grav, UA: 1.015 (ref 1.010–1.025)
Urobilinogen, UA: 0.2 U/dL — AB
pH, UA: 7 (ref 5.0–8.0)

## 2024-06-11 LAB — POCT URINE PREGNANCY

## 2024-06-11 MED ORDER — FLUCONAZOLE 150 MG PO TABS: 150.0000 mg | ORAL_TABLET | Freq: Every day | ORAL | 0 refills | Status: AC

## 2024-06-11 MED ORDER — PHENAZOPYRIDINE HCL 200 MG PO TABS: 200.0000 mg | ORAL_TABLET | Freq: Three times a day (TID) | ORAL | 0 refills | Status: AC

## 2024-06-11 MED ORDER — NITROFURANTOIN MONOHYD MACRO 100 MG PO CAPS: 100.0000 mg | ORAL_CAPSULE | Freq: Two times a day (BID) | ORAL | 0 refills | Status: AC

## 2024-06-11 NOTE — Discharge Instructions (Addendum)
  1. Dysuria (Primary) - POC urinalysis dipstick completed UC shows no leukocytes, no nitrite, no blood, no significant sign of urinary tract infection as a cause of symptoms - POCT urine pregnancy completed in UC is negative for pregnancy - Urine Culture collected in UC and sent to lab for further testing results should be available in 2 to 3 days. - phenazopyridine (PYRIDIUM) 200 MG tablet; Take 1 tablet (200 mg total) by mouth 3 (three) times daily.  Dispense: 6 tablet; Refill: 0 - Macrobid  100 mg twice daily for 5 days for treatment of subjective urinary tract infection symptoms. - Diflucan  150 mg x 2 doses for history of antibiotic induced yeast infection.

## 2024-06-11 NOTE — ED Triage Notes (Addendum)
 Sx started Monday   Vaginal itching Dysuria  Urgency   Doesn't wants STD testing

## 2024-06-11 NOTE — ED Provider Notes (Signed)
 [facility]  Note:  This document was prepared using Conservation officer, historic buildings and may include unintentional dictation errors.  MRN: [id number] DOB: 24-Apr-1991  Subjective:   Judith Hansen is a 33 y.o. female presenting for dysuria, increased urinary frequency, suprapubic abdominal pressure x 4 to 5 days.  Patient denies any vaginal discharge, vaginal lesions, severe abdominal pain, flank pain, fever.  Patient denies any concern for STD.  Patient states that whenever she urinates she has severe burning and pain.  Patient also states that she feels like she needs to go to the bathroom all the time but is rarely able to urinate.  Patient does admit that she did take Azo yesterday for treatment of dysuria symptoms.  No current facility-administered medications for this encounter.  Current Outpatient Medications:    DULoxetine  (CYMBALTA ) 60 MG capsule, Take 1 capsule (60 mg total) by mouth at bedtime., Disp: 90 capsule, Rfl: 1   fluconazole  (DIFLUCAN ) 150 MG tablet, Take 1 tablet (150 mg total) by mouth daily. If symptoms persist take second dose of Diflucan  150 mg on day 3 to treat vaginal Candida., Disp: 2 tablet, Rfl: 0   nitrofurantoin , macrocrystal-monohydrate, (MACROBID ) 100 MG capsule, Take 1 capsule (100 mg total) by mouth 2 (two) times daily., Disp: 10 capsule, Rfl: 0   phenazopyridine (PYRIDIUM) 200 MG tablet, Take 1 tablet (200 mg total) by mouth 3 (three) times daily., Disp: 6 tablet, Rfl: 0   albuterol  (PROVENTIL  HFA;VENTOLIN  HFA) 108 (90 Base) MCG/ACT inhaler, Inhale 1-2 puffs into the lungs every 6 (six) hours as needed for wheezing or shortness of breath., Disp: 1 Inhaler, Rfl: 0   cetirizine (ZYRTEC) 10 MG tablet, Take 10 mg by mouth daily., Disp: , Rfl:    loratadine (CLARITIN) 10 MG tablet, Take 10 mg by mouth as needed for allergies., Disp: , Rfl:    Prenatal Vit-Fe Sulfate-FA-DHA (PRENATAL VITAMIN/MIN +DHA) 27-0.8-200 MG CAPS, Take 1 capsule by mouth in  the morning. (Patient not taking: Reported on 02/04/2024), Disp: , Rfl:    No Known Allergies  Past Medical History:  Diagnosis Date   ADD (attention deficit disorder)    Anxiety    Asthma    Depression    Seasonal allergies      Past Surgical History:  Procedure Laterality Date   FRACTURE SURGERY  2004   rt arm has plate   ORIF FOREARM FRACTURE Right    WISDOM TOOTH EXTRACTION      Family History  Problem Relation Age of Onset   Alcohol abuse Mother    Arthritis Mother    Depression Mother    Heart disease Maternal Grandfather     Social History   Tobacco Use   Smoking status: Every Day    Current packs/day: 0.50    Average packs/day: 0.5 packs/day for 0.3 years (0.1 ttl pk-yrs)    Types: Cigarettes    Start date: 03/02/2024    Last attempt to quit: 12/02/2015   Smokeless tobacco: Never  Vaping Use   Vaping status: Former   Substances: Nicotine  Substance Use Topics   Alcohol use: Yes    Comment: occasional; once every 3 months   Drug use: No    ROS Refer to HPI for ROS details.  Objective:    Vitals: BP 121/80 (BP Location: Right Arm)   Pulse 81   Temp 98.4 F (36.9 C) (Oral)   Resp 18   Wt 250 lb (113.4 kg)   LMP 05/31/2024 (Exact Date)  SpO2 98%   BMI 40.35 kg/m   Physical Exam Vitals and nursing note reviewed.  Constitutional:      General: She is not in acute distress.    Appearance: Normal appearance. She is well-developed. She is not ill-appearing or toxic-appearing.  HENT:     Head: Normocephalic and atraumatic.  Cardiovascular:     Rate and Rhythm: Normal rate.  Pulmonary:     Effort: Pulmonary effort is normal. No respiratory distress.  Abdominal:     Palpations: Abdomen is soft.     Tenderness: There is no abdominal tenderness. There is no right CVA tenderness or left CVA tenderness.  Skin:    General: Skin is warm and dry.  Neurological:     General: No focal deficit present.     Mental Status: She is alert and oriented to  person, place, and time.  Psychiatric:        Mood and Affect: Mood normal.        Behavior: Behavior normal.     Procedures  Results for orders placed or performed during the hospital encounter of 06/11/24 (from the past 24 hours)  POC urinalysis dipstick     Status: Abnormal   Collection Time: 06/11/24  3:07 PM  Result Value Ref Range   Color, UA yellow yellow   Clarity, UA clear clear   Glucose, UA negative negative mg/dL   Bilirubin, UA negative negative   Ketones, POC UA negative negative mg/dL   Spec Grav, UA 8.984 8.989 - 1.025   Blood, UA negative negative   pH, UA 7.0 5.0 - 8.0   Protein Ur, POC negative negative mg/dL   Urobilinogen, UA 0.2 (A) 0.2 or 1.0 E.U./dL   Nitrite, UA Negative Negative   Leukocytes, UA Negative Negative  POCT urine pregnancy     Status: Normal   Collection Time: 06/11/24  3:13 PM  Result Value Ref Range   Preg Test, Ur  Negative    Assessment and Plan :     Discharge Instructions       1. Dysuria (Primary) - POC urinalysis dipstick completed UC shows no leukocytes, no nitrite, no blood, no significant sign of urinary tract infection as a cause of symptoms - POCT urine pregnancy completed in UC is negative for pregnancy - Urine Culture collected in UC and sent to lab for further testing results should be available in 2 to 3 days. - phenazopyridine (PYRIDIUM) 200 MG tablet; Take 1 tablet (200 mg total) by mouth 3 (three) times daily.  Dispense: 6 tablet; Refill: 0 - Macrobid  100 mg twice daily for 5 days for treatment of subjective urinary tract infection symptoms. - Diflucan  150 mg x 2 doses for history of antibiotic induced yeast infection.      Cha Gomillion B Gladys Gutman   Aribella Vavra, [place], [place] 06/11/24 1533

## 2024-06-13 LAB — URINE CULTURE
Culture: <10000 — AB
Special Requests: NORMAL

## 2024-06-14 ENCOUNTER — Ambulatory Visit (HOSPITAL_COMMUNITY): Payer: Self-pay
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
## 2024-07-23 ENCOUNTER — Telehealth (HOSPITAL_COMMUNITY): Admitting: Psychiatry

## 2024-07-23 ENCOUNTER — Encounter (HOSPITAL_COMMUNITY): Payer: Self-pay | Admitting: Psychiatry

## 2024-07-23 DIAGNOSIS — F419 Anxiety disorder, unspecified: Secondary | ICD-10-CM | POA: Diagnosis not present

## 2024-07-23 DIAGNOSIS — F3342 Major depressive disorder, recurrent, in full remission: Secondary | ICD-10-CM | POA: Diagnosis not present

## 2024-07-23 MED ORDER — DULOXETINE HCL 60 MG PO CPEP: 60.0000 mg | ORAL_CAPSULE | Freq: Every evening | ORAL | 1 refills | Status: AC

## 2024-07-23 NOTE — Patient Instructions (Signed)

## 2024-07-26 ENCOUNTER — Ambulatory Visit (INDEPENDENT_AMBULATORY_CARE_PROVIDER_SITE_OTHER): Admitting: Mental Health

## 2024-07-26 DIAGNOSIS — F419 Anxiety disorder, unspecified: Secondary | ICD-10-CM | POA: Diagnosis not present

## 2024-07-26 DIAGNOSIS — F331 Major depressive disorder, recurrent, moderate: Secondary | ICD-10-CM

## 2024-07-26 NOTE — Progress Notes (Signed)
   THERAPIST PROGRESS NOTE Virtual Visit via Video Note  I connected with Judith Hansen on 07/26/24 at 10:00 AM EST by a video enabled telemedicine application and verified that I am speaking with the correct person using two identifiers.  Location: Patient: at work  Provider: office   I discussed the limitations of evaluation and management by telemedicine and the availability of in person appointments. The patient expressed understanding and agreed to proceed.  I discussed the assessment and treatment plan with the patient. The patient was provided an opportunity to ask questions and all were answered. The patient agreed with the plan and demonstrated an understanding of the instructions.   The patient was advised to call back or seek an in-person evaluation if the symptoms worsen or if the condition fails to improve as anticipated.  I provided 42 minutes of non-face-to-face time during this encounter.   Judith Bernice Savant, [facility]   Session Time: 10:05 ( 42 minutes)  Participation Level: Active  Behavioral Response: CasualAlertIrritable  Type of Therapy: Individual Therapy  Treatment Goals addressed: STG: Daily stressors. Judith Hansen will maintain stability in moods AEB ability to process stressors in balance manner with no distortions in thought process with engagement in enjoyable activities weekly within the next 90 days.   ProgressTowards Goals: Progressing  Interventions: Supportive  Summary:Judith Hansen is a 33 y.o. female who presents with dx of MDD and GAD. Presents alert and oriented; mood and affect irritable. Judith Hansen presents engaged and receptive to interventions. Thought process logical; clear. Notes thoughts o irritation with concerns for recent events in media and reports increase in depression sxs. Shares presence of suifcidal thoughts at times but denies plan or intent. Also shares stressor of holiday season and consumerism of the season. Shares presence of  stressor at home with not always receiving needed support from husband. Able to engage with therapist and exploring working to identify things in which she can and can not control and working on monitoring social media. Agrees to engage in communicate with husband and revisit household needs. Shares decrease in self-care and notes plans to spend time with friend after work. Denies safety concerns.   Suicidal/Homicidal: Nowithout intent/plan  Therapist Response: Therapist engaged [place] in tele-therapy session.Assessed for current level of functioning, sxs management and level of stressors. Provided safe space for Judith Hansen to share thoughts and feelings related to sxs and work and home. Supported in processing feelings and exploring what she can and can not control. Supported in processing concerns with event sin media and state of affairs with balance of awareness and not over saturation of information. Encouraged review of balance in daily life and attendance to self-care and increased communication with husband.   Plan: Return again in  x 7 weeks.  Diagnosis: MDD (major depressive disorder), recurrent episode, moderate (HCC)  Anxiety  Collaboration of Care: Other None  Patient/Guardian was advised Release of Information must be obtained prior to any record release in order to collaborate their care with an outside provider. Patient/Guardian was advised if they have not already done so to contact the registration department to sign all necessary forms in order for us  to release information regarding their care.   Consent: Patient/Guardian gives verbal consent for treatment and assignment of benefits for services provided during this visit. Patient/Guardian expressed understanding and agreed to proceed.   Judith Hansen, [facility] 07/26/2024

## 2024-09-27 ENCOUNTER — Ambulatory Visit (INDEPENDENT_AMBULATORY_CARE_PROVIDER_SITE_OTHER): Admitting: Mental Health

## 2024-09-27 DIAGNOSIS — F331 Major depressive disorder, recurrent, moderate: Secondary | ICD-10-CM | POA: Diagnosis not present

## 2024-09-27 DIAGNOSIS — F419 Anxiety disorder, unspecified: Secondary | ICD-10-CM | POA: Diagnosis not present

## 2024-09-27 NOTE — Progress Notes (Signed)
" ° °  THERAPIST PROGRESS NOTE Virtual Visit via Video Note  I connected with Judith Hansen on 09/27/24 at  9:00 AM EST by a video enabled telemedicine application and verified that I am speaking with the correct person using two identifiers.  Location: Patient: @ work - [employer]: remote office   I discussed the limitations of evaluation and management by telemedicine and the availability of in person appointments. The patient expressed understanding and agreed to proceed.  I discussed the assessment and treatment plan with the patient. The patient was provided an opportunity to ask questions and all were answered. The patient agreed with the plan and demonstrated an understanding of the instructions.   The patient was advised to call back or seek an in-person evaluation if the symptoms worsen or if the condition fails to improve as anticipated.  I provided 38 minutes of non-face-to-face time during this encounter.   [name], [facility]   Session Time: 9:01 am (   Participation Level: Active  Behavioral Response: CasualAlertWNL  Type of Therapy: Individual Therapy  Treatment Goals addressed: STG: Daily stressors. Meggan will maintain stability in moods AEB ability to process stressors in balance manner with no distortions in thought process with engagement in enjoyable activities weekly within the next 90 days.   ProgressTowards Goals: Progressing  Interventions: Supportive  Summary: Judith Hansen is a 34 y.o. female who presents with dx of MDD and GAD. Presents alert and oriented; mood and affect stable. Judith Hansen presents engaged and receptive to interventions. Thought process logical; clear. Reports to be doing well and for sxs at this time to be stable. Reports chief complaint of relationship with father. Shares with therapist ongoing interpersonal conflict with father and manner in which she has attempted to manage with lacking desires results. Explores  boundary setting with father and consequences. Denies other stressors and denies concerns for depression or anxiety. Shares balance in daily life with things in which she enjoys and engagement with friendships. Overall stability in moods reported. Denies safety concerns.  Suicidal/Homicidal: Nowithout intent/plan  Therapist Response:  Therapist engaged Judith Hansen in tele-therapy session.Assessed for current level of functioning, sxs management and level of stressors. Provided safe space for Judith Hansen to share thoughts and feelings related to communication and interactions with father. Provided support and encouragement ;validated feelings. Explores hx of boundary setting and ability to advocate for self with family. Explored consequences of firm boundary setting. Assessed for overall level of functioning, sxs presents and ability to engage in enjoyable activities and feelings of satisfaction in daily life. No distortions in thought observed. Reviewed session and provided follow up.   Annual assessment at next session.   Plan: Return again in x 5 weeks.  Diagnosis: MDD (major depressive disorder), recurrent episode, moderate (HCC)  Anxiety  Collaboration of Care: Other None  Patient/Guardian was advised Release of Information must be obtained prior to any record release in order to collaborate their care with an outside provider. Patient/Guardian was advised if they have not already done so to contact the registration department to sign all necessary forms in order for us  to release information regarding their care.   Consent: Patient/Guardian gives verbal consent for treatment and assignment of benefits for services provided during this visit. Patient/Guardian expressed understanding and agreed to proceed.   [name], [facility] 09/27/2024  "

## 2024-10-26 ENCOUNTER — Telehealth (HOSPITAL_COMMUNITY): Admitting: Psychiatry

## 2024-11-08 ENCOUNTER — Ambulatory Visit (HOSPITAL_COMMUNITY): Admitting: Mental Health
